# Patient Record
Sex: Female | Born: 1980 | Race: Black or African American | Hispanic: No | Marital: Married | Smoking: Never smoker
Health system: Southern US, Community
[De-identification: ages and names within clinical notes are randomized; demographics above are authoritative.]

## PROBLEM LIST (undated history)

## (undated) DIAGNOSIS — I4891 Unspecified atrial fibrillation: Secondary | ICD-10-CM

## (undated) DIAGNOSIS — M722 Plantar fascial fibromatosis: Secondary | ICD-10-CM

## (undated) DIAGNOSIS — E7800 Pure hypercholesterolemia, unspecified: Secondary | ICD-10-CM

## (undated) DIAGNOSIS — J45909 Unspecified asthma, uncomplicated: Secondary | ICD-10-CM

## (undated) DIAGNOSIS — E119 Type 2 diabetes mellitus without complications: Secondary | ICD-10-CM

## (undated) DIAGNOSIS — I10 Essential (primary) hypertension: Secondary | ICD-10-CM

---

## 2022-02-24 ENCOUNTER — Telehealth (HOSPITAL_COMMUNITY): Payer: Self-pay | Admitting: Emergency Medicine

## 2022-02-24 ENCOUNTER — Emergency Department (HOSPITAL_COMMUNITY)
Admission: EM | Admit: 2022-02-24 | Discharge: 2022-02-24 | Disposition: A | Discharge status: Home / Self Care | Payer: BLUE CROSS/BLUE SHIELD | Attending: Emergency Medicine | Admitting: Emergency Medicine

## 2022-02-24 ENCOUNTER — Encounter (HOSPITAL_COMMUNITY): Payer: Self-pay | Admitting: Emergency Medicine

## 2022-02-24 DIAGNOSIS — R0981 Nasal congestion: Secondary | ICD-10-CM | POA: Diagnosis present

## 2022-02-24 DIAGNOSIS — U071 COVID-19: Secondary | ICD-10-CM | POA: Insufficient documentation

## 2022-02-24 HISTORY — DX: Unspecified asthma, uncomplicated: J45.909

## 2022-02-24 HISTORY — DX: Pure hypercholesterolemia, unspecified: E78.00

## 2022-02-24 HISTORY — DX: Plantar fascial fibromatosis: M72.2

## 2022-02-24 HISTORY — DX: Unspecified atrial fibrillation: I48.91

## 2022-02-24 HISTORY — DX: Type 2 diabetes mellitus without complications: E11.9

## 2022-02-24 LAB — RESP PANEL BY RT-PCR (FLU A&B, COVID) ARPGX2
Influenza A by PCR: NEGATIVE
Influenza B by PCR: NEGATIVE
SARS Coronavirus 2 by RT PCR: POSITIVE — AB

## 2022-02-24 MED ORDER — PREDNISONE 20 MG PO TABS
60.0000 mg | ORAL_TABLET | ORAL | Status: AC
  Administered 2022-02-24: 60 mg via ORAL
  Filled 2022-02-24: qty 3

## 2022-02-24 MED ORDER — PREDNISONE 20 MG PO TABS: 40.0000 mg | ORAL_TABLET | Freq: Every day | ORAL | 0 refills | Status: DC

## 2022-02-24 MED ORDER — NIRMATRELVIR/RITONAVIR (PAXLOVID)TABLET: 3.0000 | ORAL_TABLET | Freq: Two times a day (BID) | ORAL | 0 refills | Status: AC

## 2022-02-24 MED ORDER — AZITHROMYCIN 250 MG PO TABS
500.0000 mg | ORAL_TABLET | Freq: Once | ORAL | Status: AC
  Administered 2022-02-24: 500 mg via ORAL
  Filled 2022-02-24: qty 2

## 2022-02-24 MED ORDER — AZITHROMYCIN 250 MG PO TABS: 250.0000 mg | ORAL_TABLET | Freq: Every day | ORAL | 0 refills | Status: AC

## 2022-02-24 NOTE — ED Triage Notes (Signed)
Patient complains of generalized body aches and nasal congestion that started three days ago. Patient states her husband tested positive for COVID last week, she was tested at her job yesterday and was negative. Patient alert, afebrile in triage, oriented, speaking in complete sentences and in no apparent distress a this time. ?

## 2022-02-24 NOTE — ED Provider Notes (Signed)
?[facility] EMERGENCY DEPARTMENT ?Provider Note ? ? ?CSN: [id number] ?Arrival date & time: 02/24/22  1525 ? ?  ? ?History ? ?Chief Complaint  ?Patient presents with  ? Generalized Body Aches  ? ? ?Mackenzie Dillon is a 41 y.o. female. ? ?HPI ?Patient presents with her husband.  She presents due to sinus congestion, flulike soreness throughout her body without focal pain, without dyspnea.  There is associated cough.  Minimal relief with OTC medication.  Symptom onset was 3 days ago.  She notes that her husband was diagnosed with COVID about 2 weeks ago, though he had minimal symptoms at that point.  She is a non-smoker, does have hypertension but does not have diabetes. ?  ? ?Home Medications ?Prior to Admission medications   ?Not on File  ?   ? ?Allergies    ?Hydrocodone and Sulfa antibiotics   ? ?Review of Systems   ?Review of Systems  ?Constitutional:   ?     Per HPI, otherwise negative  ?HENT:    ?     Per HPI, otherwise negative  ?Respiratory:    ?     Per HPI, otherwise negative  ?Cardiovascular:   ?     Per HPI, otherwise negative  ?Gastrointestinal:  Negative for vomiting.  ?Endocrine:  ?     Negative aside from HPI  ?Genitourinary:   ?     Neg aside from HPI   ?Musculoskeletal:   ?     Per HPI, otherwise negative  ?Skin: Negative.   ?Neurological:  Negative for syncope.  ? ?Physical Exam ?Updated Vital Signs ?BP 138/90 (BP Location: Right Arm)   Pulse 72   Temp 99 ?F (37.2 ?C) (Oral)   Resp 17   SpO2 98%  ?Physical Exam ?Vitals and nursing note reviewed.  ?Constitutional:   ?   General: She is not in acute distress. ?   Appearance: She is well-developed.  ?HENT:  ?   Head: Normocephalic and atraumatic.  ?Eyes:  ?   Conjunctiva/sclera: Conjunctivae normal.  ?Cardiovascular:  ?   Rate and Rhythm: Normal rate and regular rhythm.  ?Pulmonary:  ?   Effort: Pulmonary effort is normal. No respiratory distress.  ?   Breath sounds: Normal breath sounds. No stridor.  ?Abdominal:  ?   General: There  is no distension.  ?Skin: ?   General: Skin is warm and dry.  ?Neurological:  ?   Mental Status: She is alert and oriented to person, place, and time.  ?   Cranial Nerves: No cranial nerve deficit.  ?Psychiatric:     ?   Mood and Affect: Mood normal.  ? ? ?ED Results / Procedures / Treatments   ?Labs ?(all labs ordered are listed, but only abnormal results are displayed) ?Labs Reviewed  ?RESP PANEL BY RT-PCR (FLU A&B, COVID) ARPGX2  ? ? ?EKG ?None ? ?Radiology ?No results found. ? ?Procedures ?Procedures  ? ? ?Medications Ordered in ED ?Medications - No data to display ? ?ED Course/ Medical Decision Making/ A&P ?This patient presents to the ED for concern of cough, congestion, flulike illness, this involves an extensive number of treatment options, and is a complaint that carries with it a high risk of complications and morbidity.  The differential diagnosis includes COVID, influenza, bacteremia, sepsis ? ? ?Co morbidities that complicate the patient evaluation ? ?Obesity, hypertension ? ?Social Determinants of Health: ? ?Obesity, hypertension ? ?Additional history obtained: ? ?Additional history and/or information obtained from husband ?External  records from outside source obtained and reviewed including history of present illness ? ? ?After the initial evaluation, orders, including: Labs were initiated. ? ? ?Patient placed on Cardiac and Pulse-Oximetry Monitors. ?The patient was maintained on a cardiac monitor.  The cardiac monitored showed an rhythm of sinus 70 normal ?The patient was also maintained on pulse oximetry. The readings were typically 100% room air normal ? ? ?On repeat evaluation of the patient improved ? ?Lab Tests: ? ?I personally interpreted labs.  The pertinent results include:  negative, flu, COVID ? ? ? ?Dispostion / Final MDM: ? ?After consideration of the diagnostic results and the patient's response to treatment, remains awake and alert, upright, hemodynamically unremarkable.  We discussed  likelihood of bronchitis versus other viral processes.  She notes that she typically has pneumonia with this, though her lung sounds are clear, no hypoxia, is taking in consideration on discharge.  No evidence for bacteremia, sepsis, no indication for admission. ? ? ?Final Clinical Impression(s) / ED Diagnoses ?Final diagnoses:  ?Flu-like symptoms  ? ?  ?Carmin Muskrat, MD ?02/24/22 1858 ? ?

## 2022-02-24 NOTE — Discharge Instructions (Addendum)
As discussed, your evaluation today has been largely reassuring.  But, it is important that you monitor your condition carefully, and do not hesitate to return to the ED if you develop new, or concerning changes in your condition. ? ?Otherwise, please follow-up with your physician for appropriate ongoing care. ? ?

## 2022-02-24 NOTE — ED Provider Triage Note (Signed)
Emergency Medicine Provider Triage Evaluation Note ? ?Mackenzie Dillon , a 41 y.o. female  was evaluated in triage.  Pt complains of body aches, sinus congestion, nonproductive cough, fever of 3-day duration.  Patient reports her husband had COVID last week.  She states she has had a home COVID test that was negative.  She denies chest pain, shortness of breath. ? ?Review of Systems  ?Positive: As above ?Negative: As above ? ?Physical Exam  ?BP (!) 147/99 (BP Location: Right Arm)   Pulse 74   Temp 99 ?F (37.2 ?C) (Oral)   Resp 16   SpO2 98%  ?Gen:   Awake, no distress   ?Resp:  Normal effort  ?MSK:   Moves extremities without difficulty  ?Other:   ? ?Medical Decision Making  ?Medically screening exam initiated at 3:51 PM.  Appropriate orders placed.  Paighton Godette was informed that the remainder of the evaluation will be completed by another provider, this initial triage assessment does not replace that evaluation, and the importance of remaining in the ED until their evaluation is complete. ? ? ?  ?Marita Kansas, PA-C ?02/24/22 1553 ? ?

## 2022-02-25 ENCOUNTER — Other Ambulatory Visit (HOSPITAL_COMMUNITY): Payer: Self-pay

## 2022-02-25 ENCOUNTER — Emergency Department (HOSPITAL_COMMUNITY)
Admission: RE | Admit: 2022-02-25 | Discharge: 2022-02-25 | Disposition: A | Discharge status: Home / Self Care | Payer: BLUE CROSS/BLUE SHIELD | Source: Ambulatory Visit

## 2022-02-25 DIAGNOSIS — R519 Headache, unspecified: Secondary | ICD-10-CM

## 2022-08-31 ENCOUNTER — Other Ambulatory Visit: Payer: Self-pay

## 2022-08-31 ENCOUNTER — Emergency Department (HOSPITAL_COMMUNITY): Payer: Self-pay

## 2022-08-31 ENCOUNTER — Emergency Department (HOSPITAL_COMMUNITY)
Admission: EM | Admit: 2022-08-31 | Discharge: 2022-09-01 | Disposition: A | Discharge status: Home / Self Care | Payer: Self-pay | Attending: Emergency Medicine | Admitting: Emergency Medicine

## 2022-08-31 ENCOUNTER — Encounter (HOSPITAL_COMMUNITY): Payer: Self-pay | Admitting: Emergency Medicine

## 2022-08-31 DIAGNOSIS — R519 Headache, unspecified: Secondary | ICD-10-CM | POA: Insufficient documentation

## 2022-08-31 DIAGNOSIS — M549 Dorsalgia, unspecified: Secondary | ICD-10-CM | POA: Insufficient documentation

## 2022-08-31 DIAGNOSIS — Z20822 Contact with and (suspected) exposure to covid-19: Secondary | ICD-10-CM | POA: Insufficient documentation

## 2022-08-31 DIAGNOSIS — M62838 Other muscle spasm: Secondary | ICD-10-CM | POA: Insufficient documentation

## 2022-08-31 HISTORY — DX: Essential (primary) hypertension: I10

## 2022-08-31 LAB — CBC WITH DIFFERENTIAL/PLATELET
Abs Immature Granulocytes: 0.02 10*3/uL (ref 0.00–0.07)
Basophils Absolute: 0 10*3/uL (ref 0.0–0.1)
Basophils Relative: 1 %
Eosinophils Absolute: 0.1 10*3/uL (ref 0.0–0.5)
Eosinophils Relative: 2 %
HCT: 34.9 % — ABNORMAL LOW (ref 36.0–46.0)
Hemoglobin: 11.7 g/dL — ABNORMAL LOW (ref 12.0–15.0)
Immature Granulocytes: 0 %
Lymphocytes Relative: 47 %
Lymphs Abs: 3.1 10*3/uL (ref 0.7–4.0)
MCH: 32.8 pg (ref 26.0–34.0)
MCHC: 33.5 g/dL (ref 30.0–36.0)
MCV: 97.8 fL (ref 80.0–100.0)
Monocytes Absolute: 0.3 10*3/uL (ref 0.1–1.0)
Monocytes Relative: 5 %
Neutro Abs: 2.9 10*3/uL (ref 1.7–7.7)
Neutrophils Relative %: 45 %
Platelets: 335 10*3/uL (ref 150–400)
RBC: 3.57 MIL/uL — ABNORMAL LOW (ref 3.87–5.11)
RDW: 12.9 % (ref 11.5–15.5)
WBC: 6.4 10*3/uL (ref 4.0–10.5)
nRBC: 0 % (ref 0.0–0.2)

## 2022-08-31 LAB — I-STAT BETA HCG BLOOD, ED (MC, WL, AP ONLY): I-stat hCG, quantitative: <5 m[IU]/mL (ref <5)

## 2022-08-31 MED ORDER — METHOCARBAMOL 500 MG PO TABS
500.0000 mg | ORAL_TABLET | Freq: Once | ORAL | Status: AC
  Administered 2022-08-31: 500 mg via ORAL
  Filled 2022-08-31: qty 1

## 2022-08-31 MED ORDER — LIDOCAINE 5 % EX PTCH
2.0000 | MEDICATED_PATCH | Freq: Every day | CUTANEOUS | Status: DC
  Administered 2022-08-31: 2 via TRANSDERMAL
  Filled 2022-08-31 (×3): qty 2

## 2022-08-31 NOTE — ED Triage Notes (Signed)
Patient reports occipital headache " spasms" onset yesterday radiating down to neck , denies head injury /no stiffness or fever .

## 2022-08-31 NOTE — ED Provider Triage Note (Signed)
Emergency Medicine Provider Triage Evaluation Note  Mackenzie Dillon , a 41 y.o. female  was evaluated in triage.  Pt complains of headache. Patient reports pain is to the back of her head and into her neck and upper back. Back pain for a a little while now, head/neck pain more so since yesterday. Constant, no alleviating/aggravating factors. Has some bilateral hand paresthesias at times with intermittent vision changes but has had vision changes in the past.. Denies fever, chills, dizziness, syncope, head injury, nausea, or vomiting. Hx of migraines but this is different, denies hx of similar headache.   Review of Systems  Per above.   Physical Exam  BP (!) 141/84 (BP Location: Right Arm)   Pulse 61   Temp 99.2 F (37.3 C) (Oral)   Resp 18   LMP 08/18/2022   SpO2 100%  Gen:   Awake, no distress   Resp:  Normal effort  MSK:   Moves extremities without difficulty  Other:  PERRL EOMI. Clear speech. No facial droop. Sensation grossly intact x 4. Intact grip strength. Intact finger to nose. No pronator drift. TTP throughout cervical and upper thoracic region.   Medical Decision Making  Medically screening exam initiated at 11:02 PM.  Appropriate orders placed.  Mackenzie Dillon was informed that the remainder of the evaluation will be completed by another provider, this initial triage assessment does not replace that evaluation, and the importance of remaining in the ED until their evaluation is complete.  Headache   Amaryllis Dyke, PA-C 08/31/22 2306

## 2022-09-01 ENCOUNTER — Emergency Department (HOSPITAL_COMMUNITY): Payer: Self-pay

## 2022-09-01 LAB — BASIC METABOLIC PANEL
Anion gap: 12 (ref 5–15)
BUN: 14 mg/dL (ref 6–20)
CO2: 19 mmol/L — ABNORMAL LOW (ref 22–32)
Calcium: 8.8 mg/dL — ABNORMAL LOW (ref 8.9–10.3)
Chloride: 108 mmol/L (ref 98–111)
Creatinine, Ser: 0.75 mg/dL (ref 0.44–1.00)
GFR, Estimated: >60 mL/min (ref >60)
Glucose, Bld: 134 mg/dL — ABNORMAL HIGH (ref 70–99)
Potassium: 3.9 mmol/L (ref 3.5–5.1)
Sodium: 139 mmol/L (ref 135–145)

## 2022-09-01 LAB — RESP PANEL BY RT-PCR (FLU A&B, COVID) ARPGX2
Influenza A by PCR: NEGATIVE
Influenza B by PCR: NEGATIVE
SARS Coronavirus 2 by RT PCR: NEGATIVE

## 2022-09-01 MED ORDER — DEXAMETHASONE SODIUM PHOSPHATE 10 MG/ML IJ SOLN
10.0000 mg | Freq: Once | INTRAMUSCULAR | Status: AC
  Administered 2022-09-01: 10 mg via INTRAVENOUS
  Filled 2022-09-01: qty 1

## 2022-09-01 MED ORDER — KETOROLAC TROMETHAMINE 15 MG/ML IJ SOLN
30.0000 mg | Freq: Once | INTRAMUSCULAR | Status: AC
  Administered 2022-09-01: 30 mg via INTRAVENOUS
  Filled 2022-09-01: qty 2

## 2022-09-01 MED ORDER — DIPHENHYDRAMINE HCL 50 MG/ML IJ SOLN
12.5000 mg | Freq: Once | INTRAMUSCULAR | Status: AC
  Administered 2022-09-01: 12.5 mg via INTRAVENOUS
  Filled 2022-09-01: qty 1

## 2022-09-01 MED ORDER — PREDNISONE 20 MG PO TABS: 20.0000 mg | ORAL_TABLET | Freq: Every day | ORAL | 0 refills | Status: AC

## 2022-09-01 MED ORDER — METHOCARBAMOL 500 MG PO TABS: 500.0000 mg | ORAL_TABLET | Freq: Two times a day (BID) | ORAL | 0 refills | Status: DC

## 2022-09-01 MED ORDER — LIDOCAINE 5 % EX PTCH: 1.0000 | MEDICATED_PATCH | CUTANEOUS | 0 refills | Status: DC

## 2022-09-01 MED ORDER — PROCHLORPERAZINE EDISYLATE 10 MG/2ML IJ SOLN
10.0000 mg | Freq: Once | INTRAMUSCULAR | Status: AC
  Administered 2022-09-01: 10 mg via INTRAVENOUS
  Filled 2022-09-01: qty 2

## 2022-09-01 NOTE — Discharge Instructions (Signed)
Take medications as perscrbed.  Use warm compress to your neck.  Return for new or worsening symptoms.

## 2022-09-01 NOTE — ED Notes (Signed)
Pt had to leave to go to work, he requested an update once the dr sees his [employer] [phone]

## 2022-09-01 NOTE — ED Provider Notes (Signed)
[facility] EMERGENCY DEPARTMENT Provider Note   CSN: [id number] Arrival date & time: 08/31/22  2233    History  Chief Complaint  Patient presents with   Headache    Mackenzie Dillon is a 41 y.o. female here for evaluation of headache and back pain. Began last week.  Reports the pain is in the posterior aspect of her head into her neck and upper back. Spreads into her bilateral shoulders.  States she has occasional tingling in her bilateral hands however this has been present previously. No recent injury or trauma.  No diplopia.  No history of MS.  No fever, sudden onset thunderclap headache.  No weakness, syncope.  Pain worse with movement and palpation to neck/ posterior aspect head. Has history of migraines however states this feels different.  Given lidocaine patch as well as muscle relaxer in triage.  She is unsure if this is helped. Gets BIL floaters with hx of migraines.  HPI     Home Medications Prior to Admission medications   Medication Sig Start Date End Date Taking? Authorizing Provider  lidocaine (LIDODERM) 5 % Place 1 patch onto the skin daily. Remove & Discard patch within 12 hours or as directed by MD 09/01/22  Yes Heiley Shaikh A, PA-C  methocarbamol (ROBAXIN) 500 MG tablet Take 1 tablet (500 mg total) by mouth 2 (two) times daily. 09/01/22  Yes Kodah Maret A, PA-C  predniSONE (DELTASONE) 20 MG tablet Take 1 tablet (20 mg total) by mouth daily for 4 days. 09/01/22 09/05/22 Yes Deshanda Molitor A, PA-C      Allergies    Hydrocodone and Sulfa antibiotics    Review of Systems   Review of Systems  Constitutional: Negative.   HENT: Negative.    Respiratory: Negative.    Cardiovascular: Negative.   Gastrointestinal: Negative.   Genitourinary: Negative.   Musculoskeletal:  Positive for neck pain. Negative for arthralgias, back pain, gait problem, joint swelling, myalgias and neck stiffness.  Skin: Negative.   Neurological:  Positive for  headaches. Negative for dizziness, tremors, seizures, syncope, facial asymmetry, speech difficulty, weakness, light-headedness and numbness.  All other systems reviewed and are negative.   Physical Exam Updated Vital Signs BP 126/85   Pulse 65   Temp 98 F (36.7 C) (Oral)   Resp 20   LMP 08/18/2022   SpO2 98%  Physical Exam Physical Exam  Constitutional: Pt is oriented to person, place, and time. Pt appears well-developed and well-nourished. No distress.  HENT:  Head: Normocephalic and atraumatic.  Mouth/Throat: Oropharynx is clear and moist.  Eyes: Conjunctivae and EOM are normal. Pupils are equal, round, and reactive to light. No scleral icterus.  No horizontal, vertical or rotational nystagmus  Neck: Normal range of motion. Neck supple.  Full active and passive ROM without pain No midline or paraspinal tenderness No nuchal rigidity or meningeal signs  Diffuse tenderness to BIL trapezius region. Cardiovascular: Normal rate, regular rhythm and intact distal pulses.   Pulmonary/Chest: Effort normal and breath sounds normal. No respiratory distress. Pt has no wheezes. No rales.  Abdominal: Soft. Bowel sounds are normal. There is no tenderness. There is no rebound and no guarding.  Musculoskeletal: Normal range of motion.  Lymphadenopathy:    No cervical adenopathy.  Neurological: Pt. is alert and oriented to person, place, and time. He has normal reflexes. No cranial nerve deficit.  Exhibits normal muscle tone. Coordination normal.  Mental Status:  Alert, oriented, thought content appropriate. Speech fluent without evidence of aphasia.  Able to follow 2 step commands without difficulty.  Cranial Nerves:  2-12 grossly intact Motor:  Equal strength bil Intact sensation Cerebellar: intact Gait: normal gait CV: Pulses eqial BIL Skin: Skin is warm and dry. No rash noted. Pt is not diaphoretic.  Psychiatric: Pt has a normal mood and affect. Behavior is normal. Judgment and thought  content normal.  Nursing note and vitals reviewed.  ED Results / Procedures / Treatments   Labs (all labs ordered are listed, but only abnormal results are displayed) Labs Reviewed  CBC WITH DIFFERENTIAL/PLATELET - Abnormal; Notable for the following components:      Result Value   RBC 3.57 (*)    Hemoglobin 11.7 (*)    HCT 34.9 (*)    All other components within normal limits  BASIC METABOLIC PANEL - Abnormal; Notable for the following components:   CO2 19 (*)    Glucose, Bld 134 (*)    Calcium 8.8 (*)    All other components within normal limits  RESP PANEL BY RT-PCR (FLU A&B, COVID) ARPGX2  I-STAT BETA HCG BLOOD, ED ([facility], [facility], [facility] ONLY)    EKG None  Radiology CT Head Wo Contrast  Result Date: 09/01/2022 CLINICAL DATA:  Occipital headache. EXAM: CT HEAD WITHOUT CONTRAST TECHNIQUE: Contiguous axial images were obtained from the base of the skull through the vertex without intravenous contrast. RADIATION DOSE REDUCTION: This exam was performed according to the departmental dose-optimization program which includes automated exposure control, adjustment of the mA and/or kV according to patient size and/or use of iterative reconstruction technique. COMPARISON:  February 24, 2022 FINDINGS: Brain: No evidence of acute infarction, hemorrhage, hydrocephalus, extra-axial collection or mass lesion/mass effect. Vascular: No hyperdense vessel or unexpected calcification. Skull: Normal. Negative for fracture or focal lesion. Sinuses/Orbits: No acute finding. Other: None. IMPRESSION: No acute intracranial pathology. Electronically Signed   By: Aram Candela M.D.   On: 09/01/2022 00:23    Procedures Procedures    Medications Ordered in ED Medications  lidocaine (LIDODERM) 5 % 2 patch (2 patches Transdermal Patch Applied 08/31/22 2310)  methocarbamol (ROBAXIN) tablet 500 mg (500 mg Oral Given 08/31/22 2310)  ketorolac (TORADOL) 15 MG/ML injection 30 mg (30 mg Intravenous Given 09/01/22 0815)   prochlorperazine (COMPAZINE) injection 10 mg (10 mg Intravenous Given 09/01/22 0816)  diphenhydrAMINE (BENADRYL) injection 12.5 mg (12.5 mg Intravenous Given 09/01/22 0818)  dexamethasone (DECADRON) injection 10 mg (10 mg Intravenous Given 09/01/22 7829)    ED Course/ Medical Decision Making/ A&P    Patient here for evaluation of pain that begins in her central region of her head extending to the back of her neck and into her bilateral shoulders.  She is a nonfocal neuro exam without deficits.  I am able to reproduce her pain on exam.  Denies any recent injury or trauma.  Work-up work-up started from triage today personally reviewed  Labs and imaging personally viewed and interpreted:  CBC without leukocytosis, hemoglobin 8.7, admits to heavy menstrual cycles COVID, flu negative Metabolic panel glucose 134 Pregnancy test negative CT scan without significant abnormality.  Different diagnosis includes: Migraine, meningitis, Fx, dislocation, MS, transverse myelitis, infectious process, abscess, dissection, VTE  Patient given migraine cocktail.  I reassessed her after this.  Her pain is significantly improved.  Her pain is likely MSK in etiology.  At this time I have low suspicion for acute neurosurgical process, infectious process or vascular etiology of cause of her symptoms.  She has full range of motion to her neck  symptoms not seen consistent with meningitis.  Recent traumatic injuries to suggest traumatic cause.  No chest pain, shortness of breath, PERC negative to suggest abnormal or thoracic etiology of her pain.  Low suspicion for MS as cause of her symptoms.  DC home with short course of medications to help.  She will return for new or worsening symptoms.  The patient has been appropriately medically screened and/or stabilized in the ED. I have low suspicion for any other emergent medical condition which would require further screening, evaluation or treatment in the ED or require  inpatient management.  Patient is hemodynamically stable and in no acute distress.  Patient able to ambulate in department prior to ED.  Evaluation does not show acute pathology that would require ongoing or additional emergent interventions while in the emergency department or further inpatient treatment.  I have discussed the diagnosis with the patient and answered all questions.  Pain is been managed while in the emergency department and patient has no further complaints prior to discharge.  Patient is comfortable with plan discussed in room and is stable for discharge at this time.  I have discussed strict return precautions for returning to the emergency department.  Patient was encouraged to follow-up with PCP/specialist refer to at discharge.                            Medical Decision Making Amount and/or Complexity of Data Reviewed External Data Reviewed: labs, radiology and notes. Labs: ordered. Decision-making details documented in ED Course. Radiology: ordered and independent interpretation performed. Decision-making details documented in ED Course.  Risk OTC drugs. Prescription drug management. Parenteral controlled substances. Decision regarding hospitalization. Diagnosis or treatment significantly limited by social determinants of health.          Final Clinical Impression(s) / ED Diagnoses Final diagnoses:  Acute nonintractable headache, unspecified headache type  Trapezius muscle spasm    Rx / DC Orders ED Discharge Orders          Ordered    methocarbamol (ROBAXIN) 500 MG tablet  2 times daily        09/01/22 1017    predniSONE (DELTASONE) 20 MG tablet  Daily        09/01/22 1017    lidocaine (LIDODERM) 5 %  Every 24 hours        09/01/22 1017              Nolan Tuazon A, PA-C 09/01/22 1025    Terald Sleeper, MD 09/01/22 1157

## 2022-10-19 ENCOUNTER — Ambulatory Visit (HOSPITAL_COMMUNITY)
Admission: EM | Admit: 2022-10-19 | Discharge: 2022-10-19 | Disposition: A | Discharge status: Home / Self Care | Payer: Self-pay | Attending: Family Medicine | Admitting: Family Medicine

## 2022-10-19 ENCOUNTER — Encounter (HOSPITAL_COMMUNITY): Payer: Self-pay | Admitting: Emergency Medicine

## 2022-10-19 ENCOUNTER — Other Ambulatory Visit: Payer: Self-pay

## 2022-10-19 DIAGNOSIS — N3090 Cystitis, unspecified without hematuria: Secondary | ICD-10-CM | POA: Insufficient documentation

## 2022-10-19 LAB — POCT URINALYSIS DIPSTICK, ED / UC
Bilirubin Urine: NEGATIVE
Glucose, UA: 100 mg/dL — AB
Leukocytes,Ua: NEGATIVE
Nitrite: POSITIVE — AB
Protein, ur: NEGATIVE mg/dL
Specific Gravity, Urine: 1.025 (ref 1.005–1.030)
Urobilinogen, UA: 1 mg/dL (ref 0.0–1.0)
pH: 5.5 (ref 5.0–8.0)

## 2022-10-19 MED ORDER — CEPHALEXIN 500 MG PO CAPS: 500.0000 mg | ORAL_CAPSULE | Freq: Two times a day (BID) | ORAL | 0 refills | Status: DC

## 2022-10-19 NOTE — ED Triage Notes (Addendum)
Yesterday evening noticed frequent urination.  Took one azo.  Reports frequent need to urinate for a small amount of urine.  Denies abdominal pain, denies back pain.    Patient is requesting a paper script

## 2022-10-20 LAB — URINE CULTURE: Culture: NO GROWTH

## 2022-10-20 NOTE — ED Provider Notes (Signed)
  [facility]    ASSESSMENT & PLAN:  1. Cystitis    Meds ordered this encounter  Medications   cephALEXin (KEFLEX) 500 MG capsule    Sig: Take 1 capsule (500 mg total) by mouth 2 (two) times daily.    Dispense:  10 capsule    Refill:  0   Not enough urine to send for culture.  No signs of pyelonephritis. Urine culture sent. Will notify patient of any significant results. Will follow up with her PCP or here if not showing improvement over the next 48 hours, sooner if needed.  Outlined signs and symptoms indicating need for more acute intervention. Patient verbalized understanding. After Visit Summary given.  SUBJECTIVE:  Mackenzie Dillon is a 41 y.o. female who, yesterday evening, noticed frequent urination. Took one azo. Reports frequent need to urinate for a small amount of urine.  Denies abdominal pain, denies back pain.   Afebrile. Very distant h/o UTI.  LMP: Patient's last menstrual period was 10/13/2022.  OBJECTIVE:  Vitals:   10/19/22 1953  BP: (!) 140/94  Pulse: 76  Resp: 18  Temp: 99.1 F (37.3 C)  TempSrc: Oral  SpO2: 96%   General appearance: alert; no distress HENT: oropharynx: moist Lungs: unlabored respirations Extremities: no edema Skin: warm and dry Neurologic: normal gait Psychological: alert and cooperative; normal mood and affect  Labs Reviewed  POCT URINALYSIS DIPSTICK, ED / UC - Abnormal; Notable for the following components:      Result Value   Glucose, UA 100 (*)    Ketones, ur TRACE (*)    Hgb urine dipstick TRACE (*)    Nitrite POSITIVE (*)    All other components within normal limits  URINE CULTURE    Allergies  Allergen Reactions   Hydrocodone Itching   Sulfa Antibiotics     Past Medical History:  Diagnosis Date   Asthma    Atrial fibrillation (HCC)    Diabetes mellitus without complication (HCC)    Hypercholesterolemia    Hypertension    Plantar fasciitis    Social History   Socioeconomic History    Marital status: Married    Spouse name: Not on file   Number of children: Not on file   Years of education: Not on file   Highest education level: Not on file  Occupational History   Not on file  Tobacco Use   Smoking status: Never   Smokeless tobacco: Never  Vaping Use   Vaping Use: Never used  Substance and Sexual Activity   Alcohol use: Never   Drug use: Never   Sexual activity: Not on file  Other Topics Concern   Not on file  Social History Narrative   Not on file   Social Determinants of Health   Financial Resource Strain: Not on file  Food Insecurity: Not on file  Transportation Needs: Not on file  Physical Activity: Not on file  Stress: Not on file  Social Connections: Not on file  Intimate Partner Violence: Not on file   History reviewed. No pertinent family history.      Mardella Layman, MD 10/20/22 [phone]

## 2022-11-16 ENCOUNTER — Ambulatory Visit
Admission: EM | Admit: 2022-11-16 | Discharge: 2022-11-16 | Disposition: A | Discharge status: Home / Self Care | Payer: Self-pay | Attending: Emergency Medicine | Admitting: Emergency Medicine

## 2022-11-16 DIAGNOSIS — B349 Viral infection, unspecified: Secondary | ICD-10-CM | POA: Insufficient documentation

## 2022-11-16 DIAGNOSIS — Z1152 Encounter for screening for COVID-19: Secondary | ICD-10-CM | POA: Insufficient documentation

## 2022-11-16 LAB — RESP PANEL BY RT-PCR (FLU A&B, COVID) ARPGX2
Influenza A by PCR: NEGATIVE
Influenza B by PCR: NEGATIVE
SARS Coronavirus 2 by RT PCR: NEGATIVE

## 2022-11-16 NOTE — Discharge Instructions (Signed)
You received a COVID-19 and influenza PCR test today.  The results of your PCR testing will be posted to your MyChart once it is complete.  This typically takes 6 to 12 hours.    If your influenza PCR test is positive, you will be contacted by phone.  Please complete full 5-day course of Tamiflu, a prescription will be provided for you.     If your COVID-19 PCR test is positive, you will be contacted by phone.  Please discuss with the callback nurse whether or not you would benefit from antiviral therapy treatment for COVID-19.    Please read below to learn more about the medications, dosages and frequencies that I recommend to help alleviate your symptoms and to get you feeling better soon:   Advil, Motrin (ibuprofen): This is a good anti-inflammatory medication which addresses aches, pains and inflammation of the upper airways that causes sinus and nasal congestion as well as in the lower airways which makes your cough feel tight and sometimes burn.  I recommend that you take between 400 to 600 mg every 6-8 hours as needed.      Tylenol (acetaminophen): This is a good fever reducer.  If your body temperature rises above 101.5 as measured with a thermometer, it is recommended that you take 1,000 mg every 8 hours until your temperature falls below 101.5, please not take more than 3,000 mg of acetaminophen either as a separate medication or as in ingredient in an over-the-counter cold/flu preparation within a 24-hour period.      Robitussin, Mucinex (guaifenesin): This is an expectorant.  This helps break up chest congestion and loosen up thick nasal drainage making phlegm and drainage more liquid and therefore easier to remove.  I recommend being 400 mg three times daily as needed.      Dextromethorphan (any cough medicine with the letters "DM" added to it's name such as Robitussin DM): This is a cough suppressant.  This is often recommended to be taken at nighttime to suppress cough and help children  sleep.  Give dosage as directed on the bottle.  This medication is available over-the-counter.   Chloraseptic Throat Spray: Spray 5 sprays into affected area every 2 hours, hold for 15 seconds and either swallow or spit it out.  This is a excellent numbing medication because it is a spray, you can put it right where you needed and so sucking on a lozenge and numbing your entire mouth.      Please follow-up within the next 5-7 days either with your primary care provider or urgent care if your symptoms do not resolve.  If you do not have a primary care provider, we will assist you in finding one.        Thank you for visiting urgent care today.  We appreciate the opportunity to participate in your care.

## 2022-11-16 NOTE — ED Provider Notes (Signed)
[facility]    CSN: [id number] Arrival date & time: 11/16/22  [id number]    HISTORY   Chief Complaint  Patient presents with   Generalized Body Aches   Sore Throat   Cough   Otalgia   Headache   HPI Mackenzie Dillon is a pleasant, 41 y.o. female who presents to urgent care today. Patient reports a 1 day history of generalized body aches, sore throat, nasal congestion, sinus headache without radiation to teeth, bilateral ear pain as well as a nonproductive cough that began this morning.  Patient denies fever but endorses chills.  Patient denies nausea, vomiting, diarrhea, known sick contacts.  Patient states she is took Sudafed this morning without meaningful relief of her symptoms, has not tried anything else.  Patient has elevated blood pressure on arrival, vital signs are otherwise normal.  The history is provided by the patient.   Past Medical History:  Diagnosis Date   Asthma    Atrial fibrillation ([place])    Diabetes mellitus without complication ([place])    Hypercholesterolemia    Hypertension    Plantar fasciitis    There are no problems to display for this patient.  History reviewed. No pertinent surgical history. OB History   No obstetric history on file.    Home Medications    Prior to Admission medications   Medication Sig Start Date End Date Taking? Authorizing Provider  atorvastatin (LIPITOR) 40 MG tablet Take 40 mg by mouth daily.    [provider]  cephALEXin (KEFLEX) 500 MG capsule Take 1 capsule (500 mg total) by mouth 2 (two) times daily. 10/19/22   Vanessa Kick, MD  escitalopram (LEXAPRO) 20 MG tablet Take 20 mg by mouth daily.    [provider]  lidocaine (LIDODERM) 5 % Place 1 patch onto the skin daily. Remove & Discard patch within 12 hours or as directed by MD Patient not taking: Reported on 10/19/2022 09/01/22   Henderly, Britni A, PA-C  lisinopril (ZESTRIL) 10 MG tablet Take 1 tablet by mouth daily. 05/18/20   [provider]  methocarbamol (ROBAXIN) 500 MG tablet Take 1 tablet (500 mg total) by mouth 2 (two) times daily. Patient not taking: Reported on 10/19/2022 09/01/22   Henderly, Britni A, PA-C  montelukast (SINGULAIR) 10 MG tablet Take 1 tablet by mouth daily. 03/02/20   [provider]    Family History History reviewed. No pertinent family history. Social History Social History   Tobacco Use   Smoking status: Never   Smokeless tobacco: Never  Vaping Use   Vaping Use: Never used  Substance Use Topics   Alcohol use: Never   Drug use: Never   Allergies   Sulfur, Hydrocodone, Metformin, and Sulfa antibiotics  Review of Systems Review of Systems Pertinent findings revealed after performing a 14 point review of systems has been noted in the history of present illness.  Physical Exam Triage Vital Signs ED Triage Vitals  Enc Vitals Group     BP 10/08/21 0827 (!) 147/82     Pulse Rate 10/08/21 0827 72     Resp 10/08/21 0827 18     Temp 10/08/21 0827 98.3 F (36.8 C)     Temp Source 10/08/21 0827 Oral     SpO2 10/08/21 0827 98 %     Weight --      Height --      Head Circumference --      Peak Flow --      Pain Score  10/08/21 0826 5     Pain Loc --      Pain Edu? --      Excl. in [place]? --   No data found.  Updated Vital Signs BP (!) 146/96 (BP Location: Right Arm)   Pulse 80   Temp 98.6 F (37 C) (Oral)   Resp 17   LMP 11/12/2022 (Approximate)   SpO2 96%   Physical Exam Vitals and nursing note reviewed.  Constitutional:      General: She is not in acute distress.    Appearance: Normal appearance. She is not ill-appearing.  HENT:     Head: Normocephalic and atraumatic.     Salivary Glands: Right salivary gland is not diffusely enlarged or tender. Left salivary gland is not diffusely enlarged or tender.     Right Ear: Tympanic membrane, ear canal and external ear normal. No drainage. No middle ear effusion. There is no impacted cerumen. Tympanic membrane is  not erythematous or bulging.     Left Ear: Tympanic membrane, ear canal and external ear normal. No drainage.  No middle ear effusion. There is no impacted cerumen. Tympanic membrane is not erythematous or bulging.     Nose: Nose normal. No nasal deformity, septal deviation, mucosal edema, congestion or rhinorrhea.     Right Turbinates: Not enlarged, swollen or pale.     Left Turbinates: Not enlarged, swollen or pale.     Right Sinus: No maxillary sinus tenderness or frontal sinus tenderness.     Left Sinus: No maxillary sinus tenderness or frontal sinus tenderness.     Mouth/Throat:     Lips: Pink. No lesions.     Mouth: Mucous membranes are moist. No oral lesions.     Pharynx: Oropharynx is clear. Uvula midline. No posterior oropharyngeal erythema or uvula swelling.     Tonsils: No tonsillar exudate. 0 on the right. 0 on the left.  Eyes:     General: Lids are normal.        Right eye: No discharge.        Left eye: No discharge.     Extraocular Movements: Extraocular movements intact.     Conjunctiva/sclera: Conjunctivae normal.     Right eye: Right conjunctiva is not injected.     Left eye: Left conjunctiva is not injected.     Pupils: Pupils are equal, round, and reactive to light.  Neck:     Trachea: Trachea and phonation normal.  Cardiovascular:     Rate and Rhythm: Normal rate and regular rhythm.     Pulses: Normal pulses.     Heart sounds: Normal heart sounds. No murmur heard.    No friction rub. No gallop.  Pulmonary:     Effort: Pulmonary effort is normal. No tachypnea, bradypnea, accessory muscle usage, prolonged expiration, respiratory distress or retractions.     Breath sounds: Normal breath sounds and air entry. No stridor, decreased air movement or transmitted upper airway sounds. No decreased breath sounds, wheezing, rhonchi or rales.  Chest:     Chest wall: No tenderness.  Musculoskeletal:        General: Normal range of motion.     Cervical back: Full passive  range of motion without pain, normal range of motion and neck supple. Normal range of motion.  Lymphadenopathy:     Cervical: No cervical adenopathy.  Skin:    General: Skin is warm and dry.     Findings: No erythema or rash.  Neurological:     General:  No focal deficit present.     Mental Status: She is alert and oriented to person, place, and time.  Psychiatric:        Mood and Affect: Mood normal.        Behavior: Behavior normal.     Visual Acuity Right Eye Distance:   Left Eye Distance:   Bilateral Distance:    Right Eye Near:   Left Eye Near:    Bilateral Near:     [facility] / Diagnostics / Procedures:     Radiology No results found.  Procedures Procedures (including critical care time) EKG  Pending results:  Labs Reviewed  RESP PANEL BY RT-PCR (FLU A&B, COVID) [id number]    Medications Ordered in UC: Medications - No data to display  UC Diagnoses / Final Clinical Impressions(s)   I have reviewed the triage vital signs and the nursing notes.  Pertinent labs & imaging results that were available during my care of the patient were reviewed by me and considered in my medical decision making (see chart for details).    Final diagnoses:  Viral illness   Patient tested for COVID-19 and influenza given complaints of body aches, fatigue, chills and sore throat.  We will notify patient of results once received.  Patient would benefit from Tamiflu if influenza test is positive.  Patient has a history of very well-controlled type 2 diabetes without the use of medications.  It is my opinion that this patient does not have any significant comorbidities that would increase her risk of hospitalization should she test positive for COVID-19.  Conservative care recommended, supportive medications advised.  Return precautions advised.  ED Prescriptions   None    PDMP not reviewed this encounter.  Disposition Upon Discharge:  Condition: stable for discharge home Home: take  medications as prescribed; routine discharge instructions as discussed; follow up as advised.  Patient presented with an acute illness with associated systemic symptoms and significant discomfort requiring urgent management. In my opinion, this is a condition that a prudent lay person (someone who possesses an average knowledge of health and medicine) may potentially expect to result in complications if not addressed urgently such as respiratory distress, impairment of bodily function or dysfunction of bodily organs.   Routine symptom specific, illness specific and/or disease specific instructions were discussed with the patient and/or caregiver at length.   As such, the patient has been evaluated and assessed, work-up was performed and treatment was provided in alignment with urgent care protocols and evidence based medicine.  Patient/parent/caregiver has been advised that the patient may require follow up for further testing and treatment if the symptoms continue in spite of treatment, as clinically indicated and appropriate.  If the patient was tested for COVID-19, Influenza and/or RSV, then the patient/parent/guardian was advised to isolate at home pending the results of his/her diagnostic coronavirus test and potentially longer if they're positive. I have also advised pt that if his/her COVID-19 test returns positive, it's recommended to self-isolate for at least 10 days after symptoms first appeared AND until fever-free for 24 hours without fever reducer AND other symptoms have improved or resolved. Discussed self-isolation recommendations as well as instructions for household member/close contacts as per the [facility] and [place] DHHS, and also gave patient the COVID packet with this information.  Patient/parent/caregiver has been advised to return to the [facility] or PCP in 3-5 days if no better; to PCP or the Emergency Department if new signs and symptoms develop, or if the current signs  or symptoms continue to  change or worsen for further workup, evaluation and treatment as clinically indicated and appropriate  The patient will follow up with their current PCP if and as advised. If the patient does not currently have a PCP we will assist them in obtaining one.   The patient may need specialty follow up if the symptoms continue, in spite of conservative treatment and management, for further workup, evaluation, consultation and treatment as clinically indicated and appropriate.  Patient/parent/caregiver verbalized understanding and agreement of plan as discussed.  All questions were addressed during visit.  Please see discharge instructions below for further details of plan.  Discharge Instructions:   Discharge Instructions      You received a COVID-19 and influenza PCR test today.  The results of your PCR testing will be posted to your MyChart once it is complete.  This typically takes 6 to 12 hours.    If your influenza PCR test is positive, you will be contacted by phone.  Please complete full 5-day course of Tamiflu, a prescription will be provided for you.     If your COVID-19 PCR test is positive, you will be contacted by phone.  Please discuss with the callback nurse whether or not you would benefit from antiviral therapy treatment for COVID-19.    Please read below to learn more about the medications, dosages and frequencies that I recommend to help alleviate your symptoms and to get you feeling better soon:   Advil, Motrin (ibuprofen): This is a good anti-inflammatory medication which addresses aches, pains and inflammation of the upper airways that causes sinus and nasal congestion as well as in the lower airways which makes your cough feel tight and sometimes burn.  I recommend that you take between 400 to 600 mg every 6-8 hours as needed.      Tylenol (acetaminophen): This is a good fever reducer.  If your body temperature rises above 101.5 as measured with a thermometer, it is  recommended that you take 1,000 mg every 8 hours until your temperature falls below 101.5, please not take more than 3,000 mg of acetaminophen either as a separate medication or as in ingredient in an over-the-counter cold/flu preparation within a 24-hour period.      Robitussin, Mucinex (guaifenesin): This is an expectorant.  This helps break up chest congestion and loosen up thick nasal drainage making phlegm and drainage more liquid and therefore easier to remove.  I recommend being 400 mg three times daily as needed.      Dextromethorphan (any cough medicine with the letters "DM" added to it's name such as Robitussin DM): This is a cough suppressant.  This is often recommended to be taken at nighttime to suppress cough and help children sleep.  Give dosage as directed on the bottle.  This medication is available over-the-counter.   Chloraseptic Throat Spray: Spray 5 sprays into affected area every 2 hours, hold for 15 seconds and either swallow or spit it out.  This is a excellent numbing medication because it is a spray, you can put it right where you needed and so sucking on a lozenge and numbing your entire mouth.      Please follow-up within the next 5-7 days either with your primary care provider or urgent care if your symptoms do not resolve.  If you do not have a primary care provider, we will assist you in finding one.        Thank you for visiting urgent care today.  We appreciate the opportunity to participate in your care.         This office note has been dictated using Museum/gallery curator.  Unfortunately, this method of dictation can sometimes lead to typographical or grammatical errors.  I apologize for your inconvenience in advance if this occurs.  Please do not hesitate to reach out to me if clarification is needed.      Lynden Oxford Scales, PA-C 11/16/22 1056

## 2022-11-16 NOTE — ED Triage Notes (Addendum)
Pt reports having headache, body aches, sore throat, bilateral ear pain, congestion and cough that began this morning.    Home interventions: ibuprofen

## 2022-11-21 ENCOUNTER — Ambulatory Visit
Admission: EM | Admit: 2022-11-21 | Discharge: 2022-11-21 | Disposition: A | Discharge status: Home / Self Care | Payer: Self-pay | Attending: Emergency Medicine | Admitting: Emergency Medicine

## 2022-11-21 DIAGNOSIS — B9689 Other specified bacterial agents as the cause of diseases classified elsewhere: Secondary | ICD-10-CM

## 2022-11-21 DIAGNOSIS — J038 Acute tonsillitis due to other specified organisms: Secondary | ICD-10-CM

## 2022-11-21 DIAGNOSIS — H109 Unspecified conjunctivitis: Secondary | ICD-10-CM

## 2022-11-21 DIAGNOSIS — Z20818 Contact with and (suspected) exposure to other bacterial communicable diseases: Secondary | ICD-10-CM

## 2022-11-21 MED ORDER — PENICILLIN G BENZATHINE 1200000 UNIT/2ML IM SUSY
2.4000 10*6.[IU] | PREFILLED_SYRINGE | Freq: Once | INTRAMUSCULAR | Status: AC
  Administered 2022-11-21: 2.4 10*6.[IU] via INTRAMUSCULAR

## 2022-11-21 MED ORDER — CIPROFLOXACIN HCL 0.3 % OP SOLN: OPHTHALMIC | 0 refills | Status: DC

## 2022-11-21 MED ORDER — KETOROLAC TROMETHAMINE 30 MG/ML IJ SOLN
30.0000 mg | Freq: Once | INTRAMUSCULAR | Status: AC
  Administered 2022-11-21: 30 mg via INTRAMUSCULAR

## 2022-11-21 NOTE — ED Triage Notes (Addendum)
Pt c/o congestion, headache, left eye redness/drainage, left ear pain, and sore throat to left side.  Home interventions: motrin, tylenol, prescribed cough medication, sudafed

## 2022-11-21 NOTE — Discharge Instructions (Addendum)
Based on physical exam findings, I believe that you have developed a bacterial infection in your tonsils.  It is possible that this is due to your being exposed to a coworker with strep throat but I believe that is more likely due to opportunistic bacteria in the setting of viral infection.  I am sorry that you have been sick for so long, sometimes bacterial infections do not show themselves for many days after viral infections.  You were provided with an injection of penicillin today for empiric treatment of bacterial tonsillitis.  No further antibiotic treatment is needed.    You were also provided with an injection of ketorolac for your pain.  I anticipate that you will be feeling better in the next 12 to 24 hours.  For the bacterial infection in your left eye, I have sent a prescription for ciprofloxacin eyedrops to your pharmacy.  Please use them as prescribed.  You do not need to pick up this prescription right away.  You may also find that you have significant relief of the infection in your left eye just with the injection of penicillin alone.  Please let us know if you are not feeling any better in the next 24 to 48 hours.  Please go to the emergency room for further evaluation if you feel worse in the next 24 to 48 hours.  Thank you for visiting urgent care today.

## 2022-11-21 NOTE — ED Provider Notes (Signed)
[facility]    CSN: [id number] Arrival date & time: 11/21/22  1651    HISTORY   Chief Complaint  Patient presents with   Sore Throat   Headache   Conjunctivitis   HPI Mackenzie Dillon is a pleasant, 41 y.o. female who presents to urgent care today. Pt c/o congestion, headache, left eye redness/drainage, left ear pain, and sore throat to left side.   Home interventions: motrin, tylenol, prescribed cough medication, sudafed  Patient was initially seen here on December 6 and diagnosed with viral infection.  Patient went to [facility] emergency room where she was also diagnosed with viral infection.   Sore Throat Associated symptoms include headaches.  Headache Conjunctivitis Associated symptoms include headaches.   Past Medical History:  Diagnosis Date   Asthma    Atrial fibrillation (HCC)    Diabetes mellitus without complication (HCC)    Hypercholesterolemia    Hypertension    Plantar fasciitis    There are no problems to display for this patient.  History reviewed. No pertinent surgical history. OB History   No obstetric history on file.    Home Medications    Prior to Admission medications   Medication Sig Start Date End Date Taking? Authorizing Provider  atorvastatin (LIPITOR) 40 MG tablet Take 40 mg by mouth daily.    [provider]  escitalopram (LEXAPRO) 20 MG tablet Take 20 mg by mouth daily.    [provider]  lisinopril (ZESTRIL) 10 MG tablet Take 1 tablet by mouth daily. 05/18/20   [provider]  montelukast (SINGULAIR) 10 MG tablet Take 1 tablet by mouth daily. 03/02/20   [provider]    Family History History reviewed. No pertinent family history. Social History Social History   Tobacco Use   Smoking status: Never   Smokeless tobacco: Never  Vaping Use   Vaping Use: Never used  Substance Use Topics   Alcohol use: Never   Drug use: Never   Allergies   Sulfur, Hydrocodone, Metformin,  and Sulfa antibiotics  Review of Systems Review of Systems  Neurological:  Positive for headaches.   Pertinent findings revealed after performing a 14 point review of systems has been noted in the history of present illness.  Physical Exam Triage Vital Signs ED Triage Vitals  Enc Vitals Group     BP 10/08/21 0827 (!) 147/82     Pulse Rate 10/08/21 0827 72     Resp 10/08/21 0827 18     Temp 10/08/21 0827 98.3 F (36.8 C)     Temp Source 10/08/21 0827 Oral     SpO2 10/08/21 0827 98 %     Weight --      Height --      Head Circumference --      Peak Flow --      Pain Score 10/08/21 0826 5     Pain Loc --      Pain Edu? --      Excl. in GC? --   No data found.  Updated Vital Signs BP (!) 148/95 (BP Location: Left Arm)   Pulse 86   Temp 98.3 F (36.8 C) (Oral)   Resp 18   LMP 11/12/2022 (Approximate)   SpO2 96%   Physical Exam Constitutional:      General: She is not in acute distress.    Appearance: She is well-developed. She is ill-appearing. She is not toxic-appearing.  HENT:     Head: Normocephalic and atraumatic.  Salivary Glands: Right salivary gland is diffusely enlarged. Right salivary gland is not tender. Left salivary gland is diffusely enlarged and tender.     Comments: Swelling of left side along jawline    Right Ear: Hearing and external ear normal.     Left Ear: Hearing and external ear normal.     Ears:     Comments: Bilateral EACs with mild erythema, bilateral TMs are normal    Nose: Rhinorrhea present. No mucosal edema or congestion. Rhinorrhea is clear.     Right Turbinates: Not enlarged, swollen or pale.     Left Turbinates: Not enlarged or swollen.     Right Sinus: No maxillary sinus tenderness or frontal sinus tenderness.     Left Sinus: No maxillary sinus tenderness or frontal sinus tenderness.     Mouth/Throat:     Lips: Pink. No lesions.     Mouth: Mucous membranes are moist. No oral lesions or angioedema.     Dentition: No gingival  swelling.     Tongue: No lesions.     Palate: No mass.     Pharynx: Uvula midline. Pharyngeal swelling, oropharyngeal exudate, posterior oropharyngeal erythema and uvula swelling present.     Tonsils: Tonsillar exudate present. 2+ on the right. 4+ on the left.  Eyes:     General: Lids are normal. Vision grossly intact. No allergic shiner.    Extraocular Movements: Extraocular movements intact.     Conjunctiva/sclera:     Left eye: Left conjunctiva is injected. Exudate present.     Pupils: Pupils are equal, round, and reactive to light.  Neck:     Thyroid: No thyroid mass, thyromegaly or thyroid tenderness.     Trachea: Tracheal tenderness present. No abnormal tracheal secretions or tracheal deviation.     Comments: Voice is muffled Cardiovascular:     Rate and Rhythm: Normal rate and regular rhythm.     Pulses: Normal pulses.     Heart sounds: Normal heart sounds, S1 normal and S2 normal. No murmur heard.    No friction rub. No gallop.  Pulmonary:     Effort: Pulmonary effort is normal. No tachypnea, bradypnea, accessory muscle usage, prolonged expiration, respiratory distress or retractions.     Breath sounds: Normal breath sounds. No stridor, decreased air movement or transmitted upper airway sounds. No decreased breath sounds, wheezing, rhonchi or rales.  Abdominal:     General: Bowel sounds are normal.     Palpations: Abdomen is soft.     Tenderness: There is generalized abdominal tenderness. There is no right CVA tenderness, left CVA tenderness or rebound. Negative signs include Murphy's sign.     Hernia: No hernia is present.  Musculoskeletal:        General: No tenderness. Normal range of motion.     Cervical back: Full passive range of motion without pain, normal range of motion and neck supple. Edema (Left side below jawline) present.     Right lower leg: No edema.     Left lower leg: No edema.  Lymphadenopathy:     Cervical: Cervical adenopathy present.     Right  cervical: Superficial cervical adenopathy present.     Left cervical: Superficial cervical adenopathy present.  Skin:    General: Skin is warm and dry.     Findings: No erythema, lesion or rash.  Neurological:     General: No focal deficit present.     Mental Status: She is alert and oriented to person, place, and time. Mental  status is at baseline.  Psychiatric:        Mood and Affect: Mood normal.        Behavior: Behavior normal.        Thought Content: Thought content normal.        Judgment: Judgment normal.     Visual Acuity Right Eye Distance:   Left Eye Distance:   Bilateral Distance:    Right Eye Near:   Left Eye Near:    Bilateral Near:     UC Couse / Diagnostics / Procedures:     Radiology No results found.  Procedures Procedures (including critical care time) EKG  Pending results:  Labs Reviewed - No data to display  Medications Ordered in UC: Medications  penicillin g benzathine (BICILLIN LA) 1200000 UNIT/2ML injection 2.4 Million Units (has no administration in time range)  ketorolac (TORADOL) 30 MG/ML injection 30 mg (has no administration in time range)    UC Diagnoses / Final Clinical Impressions(s)   I have reviewed the triage vital signs and the nursing notes.  Pertinent labs & imaging results that were available during my care of the patient were reviewed by me and considered in my medical decision making (see chart for details).    Final diagnoses:  Exposure to Streptococcal pharyngitis  Acute bacterial tonsillitis  Bacterial conjunctivitis of left eye   *** Please see discharge instructions below for further details of plan of care as provided to patient. ED Prescriptions     Medication Sig Dispense Auth. Provider   ciprofloxacin (CILOXAN) 0.3 % ophthalmic solution Administer 1 drop, every 2 hours, while awake, for 2 days. Then 1 drop, every 4 hours, while awake, for the next 5 days. 5 mL Theadora Rama Scales, PA-C      PDMP not  reviewed this encounter.  Disposition Upon Discharge:  Condition: stable for discharge home Home: take medications as prescribed; routine discharge instructions as discussed; follow up as advised.  Patient presented with an acute illness with associated systemic symptoms and significant discomfort requiring urgent management. In my opinion, this is a condition that a prudent lay person (someone who possesses an average knowledge of health and medicine) may potentially expect to result in complications if not addressed urgently such as respiratory distress, impairment of bodily function or dysfunction of bodily organs.   Routine symptom specific, illness specific and/or disease specific instructions were discussed with the patient and/or caregiver at length.   As such, the patient has been evaluated and assessed, work-up was performed and treatment was provided in alignment with urgent care protocols and evidence based medicine.  Patient/parent/caregiver has been advised that the patient may require follow up for further testing and treatment if the symptoms continue in spite of treatment, as clinically indicated and appropriate.  If the patient was tested for COVID-19, Influenza and/or RSV, then the patient/parent/guardian was advised to isolate at home pending the results of his/her diagnostic coronavirus test and potentially longer if they're positive. I have also advised pt that if his/her COVID-19 test returns positive, it's recommended to self-isolate for at least 10 days after symptoms first appeared AND until fever-free for 24 hours without fever reducer AND other symptoms have improved or resolved. Discussed self-isolation recommendations as well as instructions for household member/close contacts as per the [facility] and [place] [facility], and also gave patient the COVID packet with this information.  Patient/parent/caregiver has been advised to return to the [facility] or PCP in 3-5 days if no better; to PCP or the  Emergency Department if new signs and symptoms develop, or if the current signs or symptoms continue to change or worsen for further workup, evaluation and treatment as clinically indicated and appropriate  The patient will follow up with their current PCP if and as advised. If the patient does not currently have a PCP we will assist them in obtaining one.   The patient may need specialty follow up if the symptoms continue, in spite of conservative treatment and management, for further workup, evaluation, consultation and treatment as clinically indicated and appropriate.  Patient/parent/caregiver verbalized understanding and agreement of plan as discussed.  All questions were addressed during visit.  Please see discharge instructions below for further details of plan.  Discharge Instructions:   Discharge Instructions      Based on physical exam findings, I believe that you have developed a bacterial infection in your tonsils.  It is possible that this is due to your being exposed to a coworker with strep throat but I believe that is more likely due to opportunistic bacteria in the setting of viral infection.  I am sorry that you have been sick for so long, sometimes bacterial infections do not show themselves for many days after viral infections.  You were provided with an injection of penicillin today for empiric treatment of bacterial tonsillitis.  No further antibiotic treatment is needed.    You were also provided with an injection of ketorolac for your pain.  I anticipate that you will be feeling better in the next 12 to 24 hours.  For the bacterial infection in your left eye, I have sent a prescription for ciprofloxacin eyedrops to your pharmacy.  Please use them as prescribed.  You do not need to pick up this prescription right away.  You may also find that you have significant relief of the infection in your left eye just with the injection of penicillin alone.  Please let us know if  you are not feeling any better in the next 24 to 48 hours.  Please go to the emergency room for further evaluation if you feel worse in the next 24 to 48 hours.  Thank you for visiting urgent care today.        This office note has been dictated using Teaching laboratory technician.  Unfortunately, this method of dictation can sometimes lead to typographical or grammatical errors.  I apologize for your inconvenience in advance if this occurs.  Please do not hesitate to reach out to me if clarification is needed.

## 2022-11-24 ENCOUNTER — Ambulatory Visit: Payer: Self-pay

## 2022-11-24 ENCOUNTER — Telehealth: Payer: Self-pay | Admitting: Family Medicine

## 2022-11-24 DIAGNOSIS — H10023 Other mucopurulent conjunctivitis, bilateral: Secondary | ICD-10-CM

## 2022-11-24 DIAGNOSIS — J029 Acute pharyngitis, unspecified: Secondary | ICD-10-CM

## 2022-11-24 MED ORDER — POLYMYXIN B-TRIMETHOPRIM 10000-0.1 UNIT/ML-% OP SOLN: 1.0000 [drp] | Freq: Four times a day (QID) | OPHTHALMIC | 0 refills | Status: DC

## 2022-11-24 MED ORDER — LIDOCAINE VISCOUS HCL 2 % MT SOLN: 15.0000 mL | OROMUCOSAL | 0 refills | Status: DC | PRN

## 2022-11-24 NOTE — Patient Instructions (Addendum)
Veleta Miners, thank you for joining Freddy Finner, NP for today's virtual visit.  While this provider is not your primary care provider (PCP), if your PCP is located in our provider database this encounter information will be shared with them immediately following your visit.   A [facility] MyChart account gives you access to today's visit and all your visits, tests, and labs performed at [facility] " click here if you don't have a [facility] MyChart account or go to [email or web address]  Consent: (Patient) Mackenzie Dillon provided verbal consent for this virtual visit at the beginning of the encounter.  Current Medications:  Current Outpatient Medications:    lidocaine (XYLOCAINE) 2 % solution, Use as directed 15 mLs in the mouth or throat as needed for mouth pain., Disp: 100 mL, Rfl: 0   trimethoprim-polymyxin b (POLYTRIM) ophthalmic solution, Place 1 drop into both eyes in the morning, at noon, in the evening, and at bedtime., Disp: 10 mL, Rfl: 0   atorvastatin (LIPITOR) 40 MG tablet, Take 40 mg by mouth daily., Disp: , Rfl:    ciprofloxacin (CILOXAN) 0.3 % ophthalmic solution, Administer 1 drop, every 2 hours, while awake, for 2 days. Then 1 drop, every 4 hours, while awake, for the next 5 days., Disp: 5 mL, Rfl: 0   escitalopram (LEXAPRO) 20 MG tablet, Take 20 mg by mouth daily., Disp: , Rfl:    lisinopril (ZESTRIL) 10 MG tablet, Take 1 tablet by mouth daily., Disp: , Rfl:    montelukast (SINGULAIR) 10 MG tablet, Take 1 tablet by mouth daily., Disp: , Rfl:    Medications ordered in this encounter:  Meds ordered this encounter  Medications   lidocaine (XYLOCAINE) 2 % solution    Sig: Use as directed 15 mLs in the mouth or throat as needed for mouth pain.    Dispense:  100 mL    Refill:  0    Please make generic for the pt is self pay    Order Specific Question:   Supervising Provider    Answer:   Merrilee Jansky [[id number]]   trimethoprim-polymyxin b  (POLYTRIM) ophthalmic solution    Sig: Place 1 drop into both eyes in the morning, at noon, in the evening, and at bedtime.    Dispense:  10 mL    Refill:  0    Order Specific Question:   Supervising Provider    Answer:   Merrilee Jansky [[id number]]     *If you need refills on other medications prior to your next appointment, please contact your pharmacy*  Follow-Up: Call back or seek an in-person evaluation if the symptoms worsen or if the condition fails to improve as anticipated.  [facility] [phone]  Other Instructions  Please follow up if throat continues to be sore, if you can't swallow well, or tolerate liquids.  Strep Throat, Adult Strep throat is an infection of the throat. It is caused by germs (bacteria). Strep throat is common during the cold months of the year. It mostly affects children who are 24-9 years old. However, people of all ages can get it at any time of the year. This infection spreads from person to person through coughing, sneezing, or having close contact. What are the causes? This condition is caused by the Streptococcus pyogenes germ. What increases the risk? You care for young children. Children are more likely to get strep throat and may spread it to others. You go to crowded places. Germs can spread  easily in such places. You kiss or touch someone who has strep throat. What are the signs or symptoms? Fever or chills. Redness, swelling, or pain in the tonsils or throat. Pain or trouble when swallowing. White or yellow spots on the tonsils or throat. Tender glands in the neck and under the jaw. Bad breath. Red rash all over the body. This is rare. How is this treated? Medicines that kill germs (antibiotics). Medicines that treat pain or fever. These include: Ibuprofen or acetaminophen. Aspirin, only for people who are over the age of 15. Cough drops. Throat sprays. Follow these instructions at home: Medicines  Take  over-the-counter and prescription medicines only as told by your doctor. Take your antibiotic medicine as told by your doctor. Do not stop taking the antibiotic even if you start to feel better. Eating and drinking  If you have trouble swallowing, eat soft foods until your throat feels better. Drink enough fluid to keep your pee (urine) pale yellow. To help with pain, you may have: Warm fluids, such as soup and tea. Cold fluids, such as frozen desserts or popsicles. General instructions Rinse your mouth (gargle) with a salt-water mixture 3-4 times a day or as needed. To make a salt-water mixture, dissolve -1 tsp (3-6 g) of salt in 1 cup (237 mL) of warm water. Rest as much as you can. Stay home from work or school until you have been taking antibiotics for 24 hours. Do not smoke or use any products that contain nicotine or tobacco. If you need help quitting, ask your doctor. Keep all follow-up visits. How is this prevented?  Do not share food, drinking cups, or personal items. They can cause the germs to spread. Wash your hands well with soap and water. Make sure that all people in your house wash their hands well. Have family members tested if they have a fever or a sore throat. They may need an antibiotic if they have strep throat. Contact a doctor if: You have swelling in your neck that keeps getting bigger. You get a rash, cough, or earache. You cough up a thick fluid that is green, yellow-brown, or bloody. You have pain that does not get better with medicine. Your symptoms get worse instead of getting better. You have a fever. Get help right away if: You vomit. You have a very bad headache. Your neck hurts or feels stiff. You have chest pain or are short of breath. You have drooling, very bad throat pain, or changes in your voice. Your neck is swollen, or the skin gets red and tender. Your mouth is dry, or you are peeing less than normal. You keep feeling more tired or have  trouble waking up. Your joints are red or painful. These symptoms may be an emergency. Do not wait to see if the symptoms will go away. Get help right away. Call your local emergency services (911 in the [place] Summary Strep throat is an infection of the throat. It is caused by germs (bacteria). This infection can spread from person to person through coughing, sneezing, or having close contact. Take your medicines, including antibiotics, as told by your doctor. Do not stop taking the antibiotic even if you start to feel better. To prevent the spread of germs, wash your hands well with soap and water. Have others do the same. Do not share food, drinking cups, or personal items. Get help right away if you have a bad headache, chest pain, shortness of breath, a stiff or painful  neck, or you vomit. This information is not intended to replace advice given to you by your health care provider. Make sure you discuss any questions you have with your health care provider. Document Revised: 03/23/2021 Document Reviewed: 03/23/2021 Elsevier Patient Education  2023 [employer]     If you have been instructed to have an in-person evaluation today at a local Urgent Care facility, please use the link below. It will take you to a list of all of our available [facility], including address, phone number and hours of operation. Please do not delay care.  [facility]  If you or a family member do not have a primary care provider, use the link below to schedule a visit and establish care. When you choose a [facility] primary care physician or advanced practice provider, you gain a long-term partner in health. Find a Primary Care Provider  Learn more about [facility]'s in-office and virtual care options: [facility] - Get Care Now

## 2022-11-24 NOTE — Progress Notes (Signed)
Virtual Visit Consent   Mackenzie Dillon, you are scheduled for a virtual visit with a [facility] provider today. Just as with appointments in the office, your consent must be obtained to participate. Your consent will be active for this visit and any virtual visit you may have with one of our providers in the next 365 days. If you have a MyChart account, a copy of this consent can be sent to you electronically.  As this is a virtual visit, video technology does not allow for your provider to perform a traditional examination. This may limit your provider's ability to fully assess your condition. If your provider identifies any concerns that need to be evaluated in person or the need to arrange testing (such as labs, EKG, etc.), we will make arrangements to do so. Although advances in technology are sophisticated, we cannot ensure that it will always work on either your end or our end. If the connection with a video visit is poor, the visit may have to be switched to a telephone visit. With either a video or telephone visit, we are not always able to ensure that we have a secure connection.  By engaging in this virtual visit, you consent to the provision of healthcare and authorize for your insurance to be billed (if applicable) for the services provided during this visit. Depending on your insurance coverage, you may receive a charge related to this service.  I need to obtain your verbal consent now. Are you willing to proceed with your visit today? Mackenzie Dillon has provided verbal consent on 11/24/2022 for a virtual visit (video or telephone). Freddy Finner, NP  Date: 11/24/2022 9:32 AM  Virtual Visit via Video Note   I, Freddy Finner, connected with  Mackenzie Dillon  ([id number], 1981/10/31) on 11/24/22 at  9:30 AM EST by a video-enabled telemedicine application and verified that I am speaking with the correct person using two identifiers.  Location: Patient: Virtual Visit Location Patient:  Home Provider: Virtual Visit Location Provider: Home Office   I discussed the limitations of evaluation and management by telemedicine and the availability of in person appointments. The patient expressed understanding and agreed to proceed.    History of Present Illness: Mackenzie Dillon is a 41 y.o. who identifies as a female who was assigned female at birth, and is being seen today for sore throat on the right now- was on left. Was treated for tonsillitis- was treated, noted improvement. But in last two days she has noted some increased in pain on the right, and cough some. No nasal congestion, pressure.  NSAID works some- but not enough, inability to sleep well.   Needs a different eye drop sent in for cost.  11/16/22- throat pain- followed up on 11/21/22 for tonsillitis.    Problems: There are no problems to display for this patient.   Allergies:  Allergies  Allergen Reactions   Sulfur     Other Reaction(s): Headache-Intolerance, Headache-Intolerance   Hydrocodone Itching   Metformin Other (See Comments)    GI discomfort   Sulfa Antibiotics    Medications:  Current Outpatient Medications:    atorvastatin (LIPITOR) 40 MG tablet, Take 40 mg by mouth daily., Disp: , Rfl:    ciprofloxacin (CILOXAN) 0.3 % ophthalmic solution, Administer 1 drop, every 2 hours, while awake, for 2 days. Then 1 drop, every 4 hours, while awake, for the next 5 days., Disp: 5 mL, Rfl: 0   escitalopram (LEXAPRO) 20 MG tablet, Take 20 mg by  mouth daily., Disp: , Rfl:    lisinopril (ZESTRIL) 10 MG tablet, Take 1 tablet by mouth daily., Disp: , Rfl:    montelukast (SINGULAIR) 10 MG tablet, Take 1 tablet by mouth daily., Disp: , Rfl:   Observations/Objective: Patient is well-developed, well-nourished in no acute distress.  Resting comfortably  at home.  Head is normocephalic, atraumatic.  No labored breathing.  Speech is clear and coherent with logical content.  Patient is alert and oriented at baseline.     Assessment and Plan:  1. Sore throat  - lidocaine (XYLOCAINE) 2 % solution; Use as directed 15 mLs in the mouth or throat as needed for mouth pain.  Dispense: 100 mL; Refill: 0  2. Pink eye disease of both eyes  - trimethoprim-polymyxin b (POLYTRIM) ophthalmic solution; Place 1 drop into both eyes in the morning, at noon, in the evening, and at bedtime.  Dispense: 10 mL; Refill: 0   -rest -hydrate -please use medications as directed -strict in person precautions reviewed   Reviewed side effects, risks and benefits of medication.    Patient acknowledged agreement and understanding of the plan.   Past Medical, Surgical, Social History, Allergies, and Medications have been Reviewed.    Follow Up Instructions: I discussed the assessment and treatment plan with the patient. The patient was provided an opportunity to ask questions and all were answered. The patient agreed with the plan and demonstrated an understanding of the instructions.  A copy of instructions were sent to the patient via MyChart unless otherwise noted below.     The patient was advised to call back or seek an in-person evaluation if the symptoms worsen or if the condition fails to improve as anticipated.  Time:  I spent 10 minutes with the patient via telehealth technology discussing the above problems/concerns.    Freddy Finner, NP

## 2022-12-12 ENCOUNTER — Telehealth: Payer: Self-pay | Admitting: Physician Assistant

## 2022-12-12 ENCOUNTER — Encounter: Payer: Self-pay | Admitting: Physician Assistant

## 2022-12-12 DIAGNOSIS — J020 Streptococcal pharyngitis: Secondary | ICD-10-CM

## 2022-12-12 DIAGNOSIS — B9689 Other specified bacterial agents as the cause of diseases classified elsewhere: Secondary | ICD-10-CM

## 2022-12-12 DIAGNOSIS — J019 Acute sinusitis, unspecified: Secondary | ICD-10-CM

## 2022-12-12 MED ORDER — PREDNISONE 20 MG PO TABS: 40.0000 mg | ORAL_TABLET | Freq: Every day | ORAL | 0 refills | Status: AC

## 2022-12-12 NOTE — Progress Notes (Signed)
Virtual Visit Consent   Mackenzie Dillon, you are scheduled for a virtual visit with a [facility] provider today. Just as with appointments in the office, your consent must be obtained to participate. Your consent will be active for this visit and any virtual visit you may have with one of our providers in the next 365 days. If you have a MyChart account, a copy of this consent can be sent to you electronically.  As this is a virtual visit, video technology does not allow for your provider to perform a traditional examination. This may limit your provider's ability to fully assess your condition. If your provider identifies any concerns that need to be evaluated in person or the need to arrange testing (such as labs, EKG, etc.), we will make arrangements to do so. Although advances in technology are sophisticated, we cannot ensure that it will always work on either your end or our end. If the connection with a video visit is poor, the visit may have to be switched to a telephone visit. With either a video or telephone visit, we are not always able to ensure that we have a secure connection.  By engaging in this virtual visit, you consent to the provision of healthcare and authorize for your insurance to be billed (if applicable) for the services provided during this visit. Depending on your insurance coverage, you may receive a charge related to this service.  I need to obtain your verbal consent now. Are you willing to proceed with your visit today? [name] has provided verbal consent on 12/12/2022 for a virtual visit (video or telephone). Mackenzie Dillon, [place]  Date: 12/12/2022 5:34 PM  Virtual Visit via Video Note   I, Mackenzie Dillon, connected with  [name]  ([id number], 1981/06/10) on 12/12/22 at  5:15 PM EST by a video-enabled telemedicine application and verified that I am speaking with the correct person using two identifiers.  Location: Patient: Virtual Visit Location  Patient: Mobile Provider: Virtual Visit Location Provider: Home Office   I discussed the limitations of evaluation and management by telemedicine and the availability of in person appointments. The patient expressed understanding and agreed to proceed.    History of Present Illness: Mackenzie Dillon is a 42 y.o. who identifies as a female who was assigned female at birth, and is being seen today for some ongoing URI symptoms after treatment for strep pharyngitis last month.  Was initially seen at urgent care at which time she was diagnosed with strep pharyngitis.  Was treated with IM penicillin and a Toradol injection.  Also had subsequent conjunctivitis and was giving an ophthalmic antibiotic drops for this.  Notes symptoms completely resolved.  Has had some residual sore throat, mainly left-sided, associated with sinus congestion, sinus pressure and pain.  Notes occasional odynophagia but denies dysphagia.  Denies noting any exudate on her tonsils.  Denies GI symptoms.  Is currently without PCP.[name]  HPI: HPI  Problems: There are no problems to display for this patient.   Allergies:  Allergies  Allergen Reactions   Sulfur     Other Reaction(s): Headache-Intolerance, Headache-Intolerance   Hydrocodone Itching   Metformin Other (See Comments)    GI discomfort   Sulfa Antibiotics    Medications:  Current Outpatient Medications:    atorvastatin (LIPITOR) 40 MG tablet, Take 40 mg by mouth daily., Disp: , Rfl:    escitalopram (LEXAPRO) 20 MG tablet, Take 20 mg by mouth daily., Disp: , Rfl:    lisinopril (ZESTRIL)  10 MG tablet, Take 1 tablet by mouth daily., Disp: , Rfl:    montelukast (SINGULAIR) 10 MG tablet, Take 1 tablet by mouth daily., Disp: , Rfl:   Observations/Objective: Patient is well-developed, well-nourished in no acute distress.  Resting comfortably at home.  Head is normocephalic, atraumatic.  No labored breathing. Speech is clear and coherent with logical content.  Patient is  alert and oriented at baseline.  Unable to fully visualize oropharynx through video due to quality.  Assessment and Plan: 1. Strep pharyngitis  2. Acute bacterial sinusitis  Concern for some lingering strep and also concern for festering sinusitis.  Supportive measures and OTC medications reviewed.  Will start azithromycin for treatment to cover both bases, giving treatment failure to penicillin.  Discussed if symptoms or not fully resolving, she will need a repeat evaluation in person for further testing.  Follow Up Instructions: I discussed the assessment and treatment plan with the patient. The patient was provided an opportunity to ask questions and all were answered. The patient agreed with the plan and demonstrated an understanding of the instructions.  A copy of instructions were sent to the patient via MyChart unless otherwise noted below.   The patient was advised to call back or seek an in-person evaluation if the symptoms worsen or if the condition fails to improve as anticipated.  Time:  I spent 10 minutes with the patient via telehealth technology discussing the above problems/concerns.    Mackenzie Rio, PA-C

## 2022-12-12 NOTE — Patient Instructions (Signed)
  Mackenzie Dillon, thank you for joining [name], PA-C for today's virtual visit.  While this provider is not your primary care provider (PCP), if your PCP is located in our provider database this encounter information will be shared with them immediately following your visit.   [facility] account gives you access to today's visit and all your visits, tests, and labs performed at [facility] " click here if you don't have a [facility] account or go to [email or web address]  Consent: (Patient) Mackenzie Dillon provided verbal consent for this virtual visit at the beginning of the encounter.  Current Medications:  Current Outpatient Medications:    atorvastatin (LIPITOR) 40 MG tablet, Take 40 mg by mouth daily., Disp: , Rfl:    ciprofloxacin (CILOXAN) 0.3 % ophthalmic solution, Administer 1 drop, every 2 hours, while awake, for 2 days. Then 1 drop, every 4 hours, while awake, for the next 5 days., Disp: 5 mL, Rfl: 0   escitalopram (LEXAPRO) 20 MG tablet, Take 20 mg by mouth daily., Disp: , Rfl:    lidocaine (XYLOCAINE) 2 % solution, Use as directed 15 mLs in the mouth or throat as needed for mouth pain., Disp: 100 mL, Rfl: 0   lisinopril (ZESTRIL) 10 MG tablet, Take 1 tablet by mouth daily., Disp: , Rfl:    montelukast (SINGULAIR) 10 MG tablet, Take 1 tablet by mouth daily., Disp: , Rfl:    trimethoprim-polymyxin b (POLYTRIM) ophthalmic solution, Place 1 drop into both eyes in the morning, at noon, in the evening, and at bedtime., Disp: 10 mL, Rfl: 0   Medications ordered in this encounter:  No orders of the defined types were placed in this encounter.    *If you need refills on other medications prior to your next appointment, please contact your pharmacy*  Follow-Up: Call back or seek an in-person evaluation if the symptoms worsen or if the condition fails to improve as anticipated.  [facility] [phone]  Other  Instructions Please keep well-hydrated and get plenty of rest. Okay to continue over-the-counter medications. Take the antibiotic and steroid as directed. If symptoms or not resolving or you note new or worsening symptoms despite treatment, you must be reevaluated in person.  Do not delay care.   If you have been instructed to have an in-person evaluation today at a local Urgent Care facility, please use the link below. It will take you to a list of all of our available [facility], including address, phone number and hours of operation. Please do not delay care.  [facility]  If you or a family member do not have a primary care provider, use the link below to schedule a visit and establish care. When you choose a [facility] primary care physician or advanced practice provider, you gain a long-term partner in health. Find a Primary Care Provider  Learn more about [facility]'s in-office and virtual care options: [facility]

## 2023-02-11 ENCOUNTER — Ambulatory Visit
Admission: EM | Admit: 2023-02-11 | Discharge: 2023-02-11 | Disposition: A | Discharge status: Home / Self Care | Payer: BLUE CROSS/BLUE SHIELD | Attending: Urgent Care | Admitting: Urgent Care

## 2023-02-11 DIAGNOSIS — U071 COVID-19: Secondary | ICD-10-CM | POA: Diagnosis not present

## 2023-02-11 DIAGNOSIS — I10 Essential (primary) hypertension: Secondary | ICD-10-CM | POA: Diagnosis not present

## 2023-02-11 MED ORDER — CETIRIZINE HCL 10 MG PO TABS: 10.0000 mg | ORAL_TABLET | Freq: Every day | ORAL | 0 refills | Status: AC

## 2023-02-11 MED ORDER — PSEUDOEPHEDRINE HCL 30 MG PO TABS: 30.0000 mg | ORAL_TABLET | Freq: Three times a day (TID) | ORAL | 0 refills | Status: AC | PRN

## 2023-02-11 MED ORDER — PROMETHAZINE-DM 6.25-15 MG/5ML PO SYRP: 5.0000 mL | ORAL_SOLUTION | Freq: Three times a day (TID) | ORAL | 0 refills | Status: AC | PRN

## 2023-02-11 MED ORDER — PAXLOVID (300/100) 20 X 150 MG & 10 X 100MG PO TBPK: ORAL_TABLET | ORAL | 0 refills | Status: AC

## 2023-02-11 NOTE — ED Triage Notes (Addendum)
Pt reports flu sx started last night-had +covid work test this am-NAD-steady gait

## 2023-02-11 NOTE — ED Provider Notes (Signed)
[facility] URGENT [facility]  Note:  This document was prepared using Systems analyst and may include unintentional dictation errors.  MRN: [id number] DOB: 11-15-1981  Subjective:   Eliz Vitatoe is a 42 y.o. female presenting for 1 day history of chills, runny and stuffy nose, coughing. No chest pain, shob, wheezing. Patient states she does not have asthma despite the diagnosis in her chart. States the same for diabetes. Reports she is borderline. No smoking of any kind including cigarettes, cigars, vaping, marijuana use.  Patient's employer is requiring a COVID test.  She tested herself using the same rapid test that the employer uses and they were both positive.  Reports that she has previously taken Paxlovid without any issues.  No current facility-administered medications for this encounter.  Current Outpatient Medications:    atorvastatin (LIPITOR) 40 MG tablet, Take 40 mg by mouth daily., Disp: , Rfl:    escitalopram (LEXAPRO) 20 MG tablet, Take 20 mg by mouth daily., Disp: , Rfl:    lisinopril (ZESTRIL) 10 MG tablet, Take 1 tablet by mouth daily., Disp: , Rfl:    montelukast (SINGULAIR) 10 MG tablet, Take 1 tablet by mouth daily., Disp: , Rfl:    predniSONE (DELTASONE) 20 MG tablet, Take 2 tablets (40 mg total) by mouth daily with breakfast., Disp: 6 tablet, Rfl: 0   Allergies  Allergen Reactions   Sulfur     Other Reaction(s): Headache-Intolerance, Headache-Intolerance   Hydrocodone Itching   Metformin Other (See Comments)    GI discomfort   Sulfa Antibiotics     Past Medical History:  Diagnosis Date   Asthma    Atrial fibrillation ([place])    Diabetes mellitus without complication ([place])    Hypercholesterolemia    Hypertension    Plantar fasciitis      History reviewed. No pertinent surgical history.  No family history on file.  Social History   Tobacco Use   Smoking status: Never   Smokeless tobacco: Never  Vaping Use   Vaping Use:  Never used  Substance Use Topics   Alcohol use: Never   Drug use: Never    ROS   Objective:   Vitals: BP 132/89 (BP Location: Right Arm)   Pulse 79   Temp 99.8 F (37.7 C) (Oral)   Resp 20   LMP 02/01/2023   SpO2 95%   Physical Exam Constitutional:      General: She is not in acute distress.    Appearance: Normal appearance. She is well-developed and normal weight. She is not ill-appearing, toxic-appearing or diaphoretic.  HENT:     Head: Normocephalic and atraumatic.     Right Ear: Tympanic membrane, ear canal and external ear normal. No drainage or tenderness. No middle ear effusion. There is no impacted cerumen. Tympanic membrane is not erythematous or bulging.     Left Ear: Tympanic membrane, ear canal and external ear normal. No drainage or tenderness.  No middle ear effusion. There is no impacted cerumen. Tympanic membrane is not erythematous or bulging.     Nose: Nose normal. No congestion or rhinorrhea.     Mouth/Throat:     Mouth: Mucous membranes are moist. No oral lesions.     Pharynx: No pharyngeal swelling, oropharyngeal exudate, posterior oropharyngeal erythema or uvula swelling.     Tonsils: No tonsillar exudate or tonsillar abscesses.  Eyes:     General: No scleral icterus.       Right eye: No discharge.  Left eye: No discharge.     Extraocular Movements: Extraocular movements intact.     Right eye: Normal extraocular motion.     Left eye: Normal extraocular motion.     Conjunctiva/sclera: Conjunctivae normal.  Cardiovascular:     Rate and Rhythm: Normal rate and regular rhythm.     Heart sounds: Normal heart sounds. No murmur heard.    No friction rub. No gallop.  Pulmonary:     Effort: Pulmonary effort is normal. No respiratory distress.     Breath sounds: No stridor. No wheezing, rhonchi or rales.  Chest:     Chest wall: No tenderness.  Musculoskeletal:     Cervical back: Normal range of motion and neck supple.  Lymphadenopathy:      Cervical: No cervical adenopathy.  Skin:    General: Skin is warm and dry.  Neurological:     General: No focal deficit present.     Mental Status: She is alert and oriented to person, place, and time.  Psychiatric:        Mood and Affect: Mood normal.        Behavior: Behavior normal.     Assessment and Plan :   PDMP not reviewed this encounter.  1. Clinical diagnosis of COVID-19   2. Essential hypertension     COVID confirmation testing pending.  Recommended starting Paxlovid for management of her COVID-19 infection.  Use supportive care otherwise.  Deferred imaging given clear cardiopulmonary exam, hemodynamically stable vital signs.  Counseled patient on potential for adverse effects with medications prescribed/recommended today, ER and return-to-clinic precautions discussed, patient verbalized understanding.    Jaynee Eagles, [place] 02/11/23 1036

## 2023-02-13 LAB — SARS CORONAVIRUS 2 (TAT 6-24 HRS): SARS Coronavirus 2: POSITIVE — AB

## 2023-02-17 ENCOUNTER — Ambulatory Visit
Admission: EM | Admit: 2023-02-17 | Discharge: 2023-02-17 | Disposition: A | Discharge status: Home / Self Care | Payer: BLUE CROSS/BLUE SHIELD | Attending: Nurse Practitioner | Admitting: Nurse Practitioner

## 2023-02-17 DIAGNOSIS — R051 Acute cough: Secondary | ICD-10-CM | POA: Insufficient documentation

## 2023-02-17 DIAGNOSIS — Z1152 Encounter for screening for COVID-19: Secondary | ICD-10-CM | POA: Insufficient documentation

## 2023-02-17 NOTE — Discharge Instructions (Signed)
The clinic will contact you with results of the COVID testing if it is positive Follow-up with your PCP if symptoms do not improve Please go to the ER for any worsening symptoms

## 2023-02-17 NOTE — ED Provider Notes (Signed)
[facility]    CSN: [id number] Arrival date & time: 02/17/23  1832      History   Chief Complaint Chief Complaint  Patient presents with   covid testing    Cough    HPI Mackenzie Dillon is a 42 y.o. female presents for COVID testing.  Patient was seen in urgent care on 3/2 and had a positive COVID PCR.  She states all her symptoms have resolved with the exception of a mild cough and some slight nasal congestion.  No fevers.  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  She needs a negative COVID test to return.  She has no other concerns at this time.   Cough   Past Medical History:  Diagnosis Date   Asthma    Atrial fibrillation ([place])    Diabetes mellitus without complication ([place])    Hypercholesterolemia    Hypertension    Plantar fasciitis     There are no problems to display for this patient.   History reviewed. No pertinent surgical history.  OB History   No obstetric history on file.      Home Medications    Prior to Admission medications   Medication Sig Start Date End Date Taking? Authorizing Provider  atorvastatin (LIPITOR) 40 MG tablet Take 40 mg by mouth daily.    [provider]  cetirizine (ZYRTEC ALLERGY) 10 MG tablet Take 1 tablet (10 mg total) by mouth daily. 02/11/23   Jaynee Eagles, PA-C  escitalopram (LEXAPRO) 20 MG tablet Take 20 mg by mouth daily.    [provider]  lisinopril (ZESTRIL) 10 MG tablet Take 1 tablet by mouth daily. 05/18/20   [provider]  montelukast (SINGULAIR) 10 MG tablet Take 1 tablet by mouth daily. 03/02/20   [provider]  nirmatrelvir & ritonavir (PAXLOVID, 300/100,) 20 x 150 MG & 10 x '100MG'$  TBPK Take 2 tablets nirmtrelvir and 1 tablet ritonavir twice daily. 02/11/23   Jaynee Eagles, PA-C  predniSONE (DELTASONE) 20 MG tablet Take 2 tablets (40 mg total) by mouth daily with breakfast. 12/12/22   Brunetta Jeans, PA-C  promethazine-dextromethorphan  (PROMETHAZINE-DM) 6.25-15 MG/5ML syrup Take 5 mLs by mouth 3 (three) times daily as needed for cough. 02/11/23   Jaynee Eagles, PA-C  pseudoephedrine (SUDAFED) 30 MG tablet Take 1 tablet (30 mg total) by mouth every 8 (eight) hours as needed for congestion. 02/11/23   Jaynee Eagles, PA-C    Family History History reviewed. No pertinent family history.  Social History Social History   Tobacco Use   Smoking status: Never   Smokeless tobacco: Never  Vaping Use   Vaping Use: Never used  Substance Use Topics   Alcohol use: Never   Drug use: Never     Allergies   Sulfur, Hydrocodone, Metformin, and Sulfa antibiotics   Review of Systems Review of Systems  Respiratory:  Positive for cough.      Physical Exam Triage Vital Signs ED Triage Vitals  Enc Vitals Group     BP 02/17/23 1848 (!) 133/91     Pulse Rate 02/17/23 1848 83     Resp 02/17/23 1848 18     Temp 02/17/23 1848 98.4 F (36.9 C)     Temp Source 02/17/23 1848 Oral     SpO2 02/17/23 1848 95 %     Weight --      Height --      Head  Circumference --      Peak Flow --      Pain Score 02/17/23 1854 0     Pain Loc --      Pain Edu? --      Excl. in [place]? --    No data found.  Updated Vital Signs BP (!) 133/91 (BP Location: Left Arm)   Pulse 83   Temp 98.4 F (36.9 C) (Oral)   Resp 18   LMP 02/01/2023   SpO2 95%   Visual Acuity Right Eye Distance:   Left Eye Distance:   Bilateral Distance:    Right Eye Near:   Left Eye Near:    Bilateral Near:     Physical Exam Vitals and nursing note reviewed.  Constitutional:      General: She is not in acute distress.    Appearance: She is well-developed. She is not ill-appearing.  HENT:     Head: Normocephalic and atraumatic.     Right Ear: Tympanic membrane and ear canal normal.     Left Ear: Tympanic membrane and ear canal normal.     Nose: Congestion present.     Mouth/Throat:     Mouth: Mucous membranes are moist.     Pharynx: Oropharynx is clear. Uvula  midline.     Tonsils: No tonsillar exudate or tonsillar abscesses.  Eyes:     Conjunctiva/sclera: Conjunctivae normal.     Pupils: Pupils are equal, round, and reactive to light.  Cardiovascular:     Rate and Rhythm: Normal rate and regular rhythm.     Heart sounds: Normal heart sounds.  Pulmonary:     Effort: Pulmonary effort is normal.     Breath sounds: Normal breath sounds.  Musculoskeletal:     Cervical back: Normal range of motion and neck supple.  Lymphadenopathy:     Cervical: No cervical adenopathy.  Skin:    General: Skin is warm and dry.  Neurological:     General: No focal deficit present.     Mental Status: She is alert and oriented to person, place, and time.  Psychiatric:        Mood and Affect: Mood normal.        Behavior: Behavior normal.      UC Treatments / Results  Labs (all labs ordered are listed, but only abnormal results are displayed) Labs Reviewed  SARS CORONAVIRUS 2 (TAT 6-24 HRS)    EKG   Radiology No results found.  Procedures Procedures (including critical care time)  Medications Ordered in UC Medications - No data to display  Initial Impression / Assessment and Plan / UC Course  I have reviewed the triage vital signs and the nursing notes.  Pertinent labs & imaging results that were available during my care of the patient were reviewed by me and considered in my medical decision making (see chart for details).     Reviewed with patient that you can test positive for COVID for up to 3 months after initial test regardless of symptoms.  She verbalized understanding COVID PCR and will contact if positive Follow-up with PCP if symptoms do not improve ER precautions reviewed and patient verbalized understanding Final Clinical Impressions(s) / UC Diagnoses   Final diagnoses:  Encounter for screening for COVID-19  Acute cough     Discharge Instructions      The clinic will contact you with results of the COVID testing if it  is positive Follow-up with your PCP if symptoms do not improve Please  go to the ER for any worsening symptoms   ED Prescriptions   None    PDMP not reviewed this encounter.   Melynda Ripple, NP 02/17/23 1905

## 2023-02-17 NOTE — ED Triage Notes (Signed)
Pt requesting covid testing, she states she recently tested positive. The patient states she does have a residual cough.

## 2023-02-18 LAB — SARS CORONAVIRUS 2 (TAT 6-24 HRS): SARS Coronavirus 2: NEGATIVE

## 2023-03-08 IMAGING — CT CT HEAD W/O CM
4 series · 17 of 47 positions shown, 19 images · non-contrast
Comparison: February 17, 2022

CLINICAL DATA: Headache and elevated blood pressure.



[Series 2: head wo · axial · 0.42mm/px · z∈[+1105,+1225]mm · 7 of 32 slices shown, 9 images]
[im 4/32  brain]
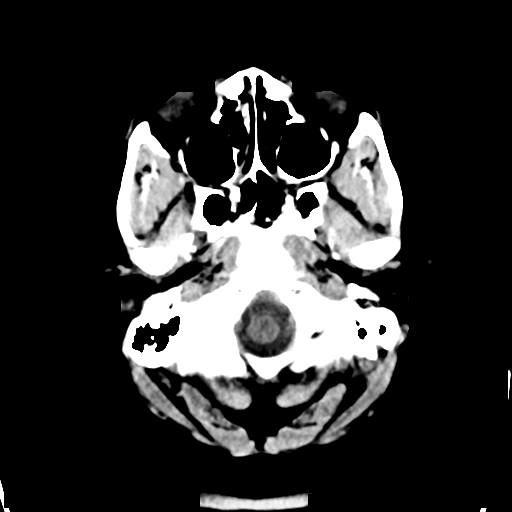
[im 4/32  bone]
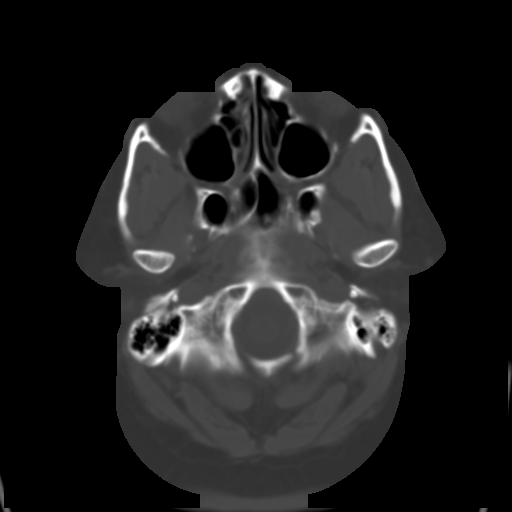
[im 8/32  brain]
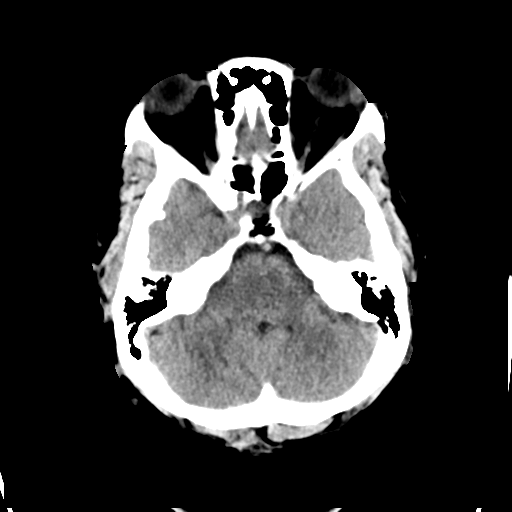
[im 12/32  brain]
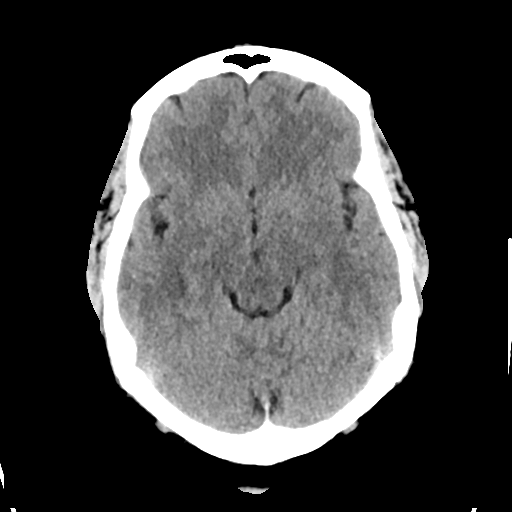
[im 16/32  brain]
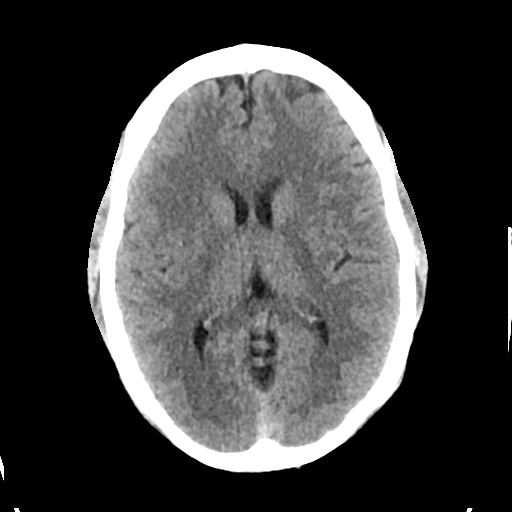
[im 20/32  brain]
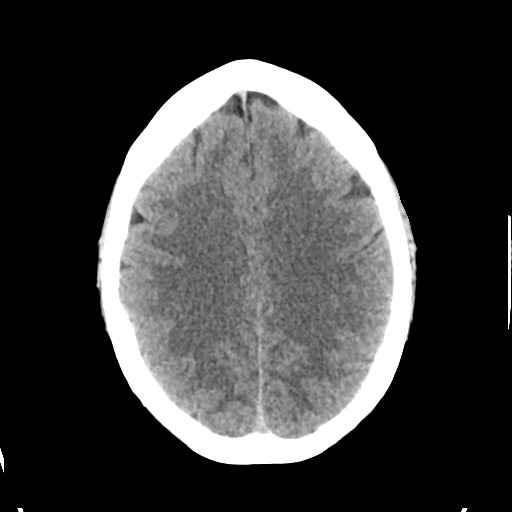
[im 20/32  bone]
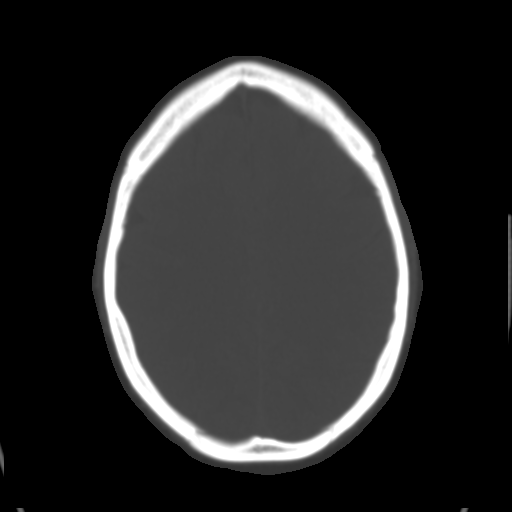
[im 24/32  brain]
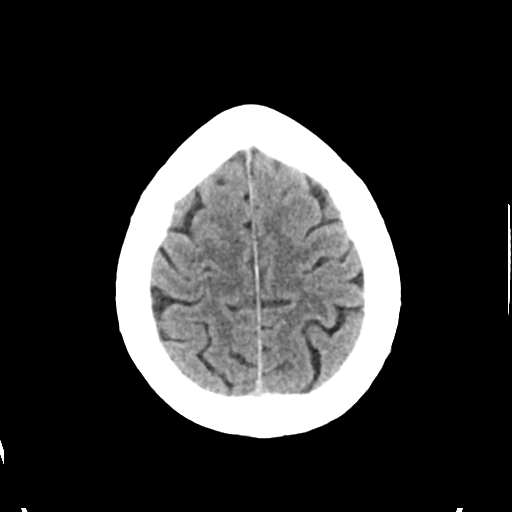
[im 28/32  brain]
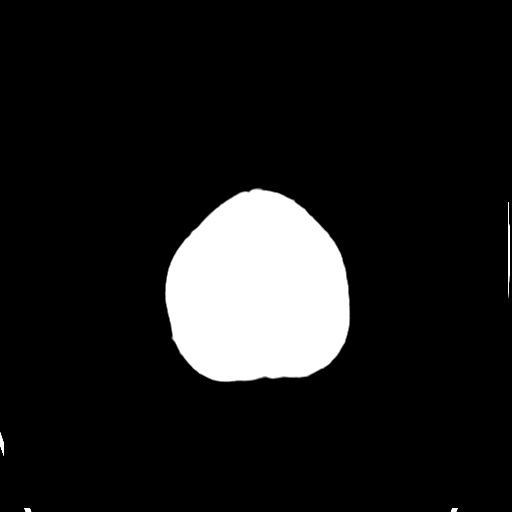

[Series 3: head bone · axial · 0.42mm/px · z∈[+1104,+1160]mm · 4 of 80 slices shown]
[im 8/80  bone]
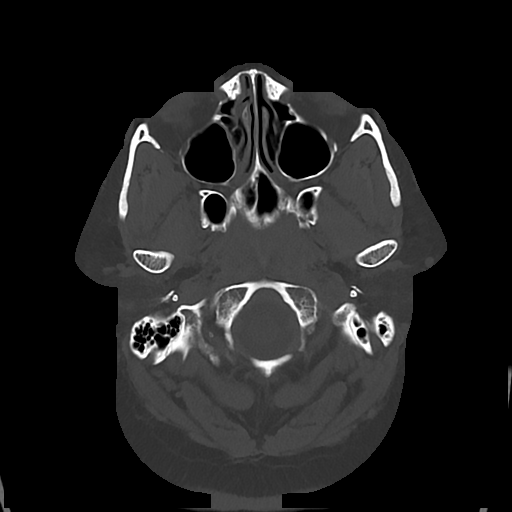
[im 16/80  bone]
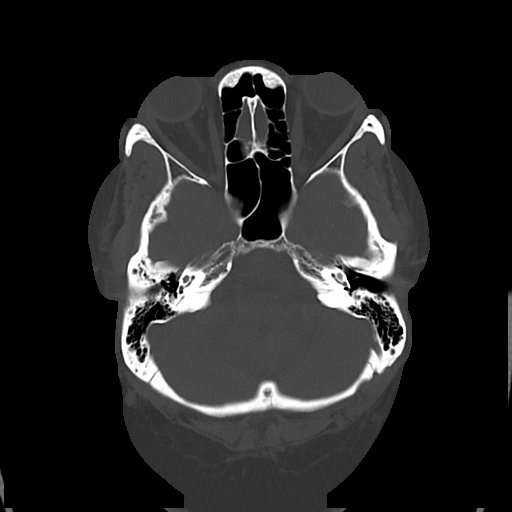
[im 24/80  bone]
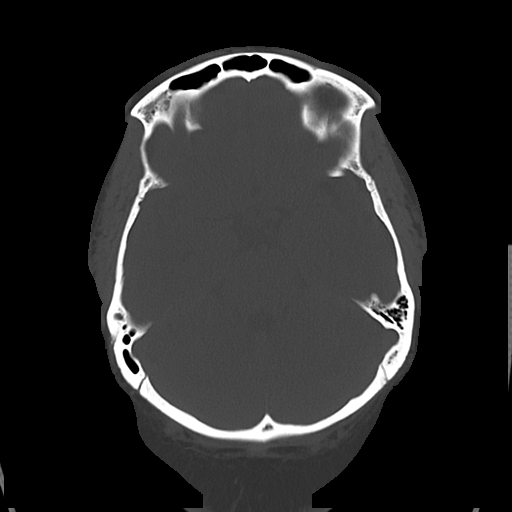
[im 36/80  bone]
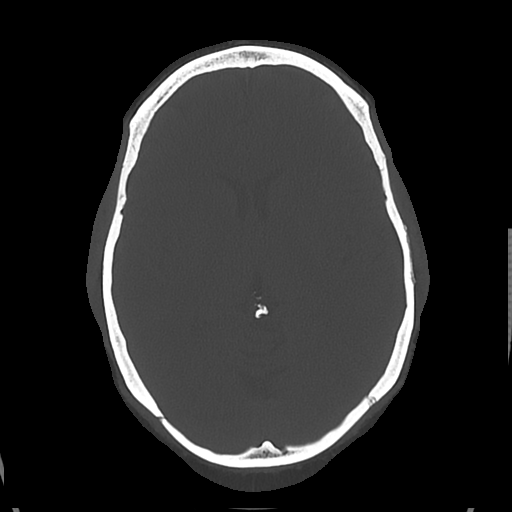

[Series 4: cor soft · coronal · 0.33mm/px · 3 of 71 slices shown]
[im 24/71  brain]
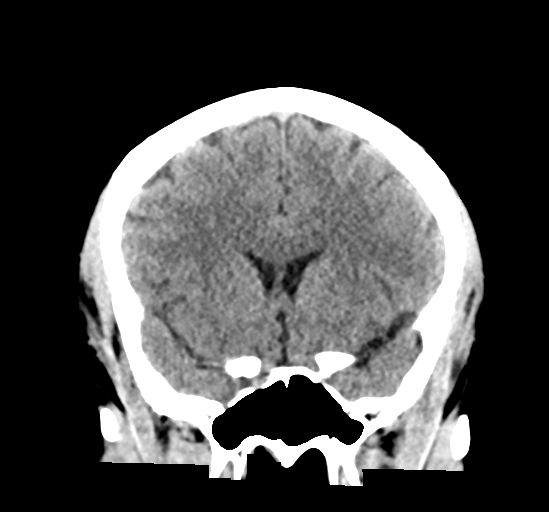
[im 32/71  brain]
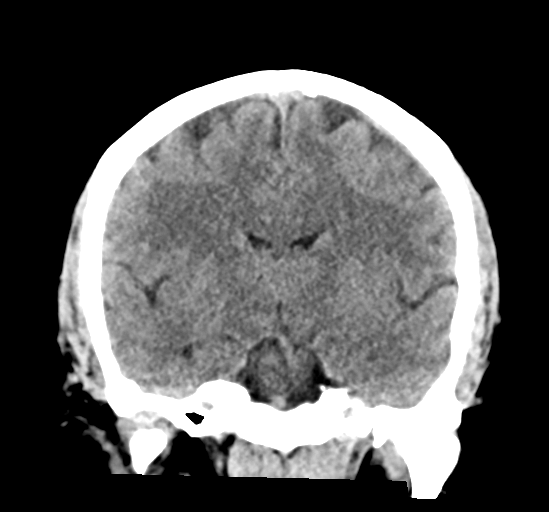
[im 39/71  brain]
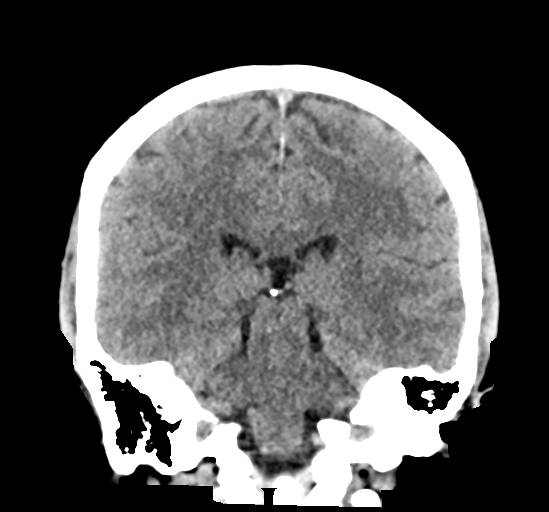

[Series 5: sag soft · sagittal · 0.33mm/px · 3 of 61 slices shown]
[im 21/61  brain]
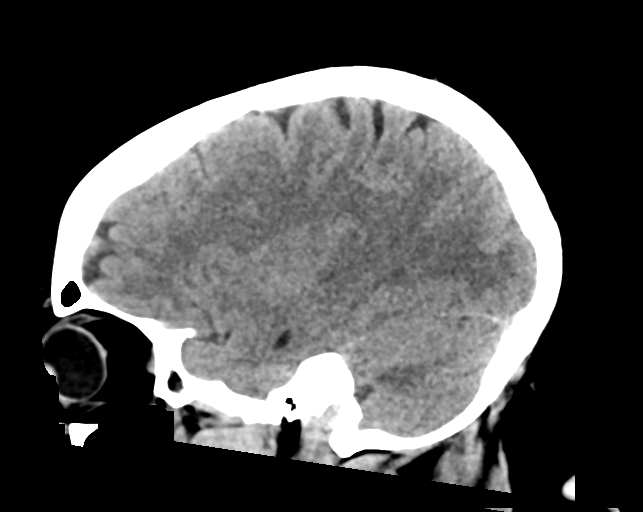
[im 31/61  brain]
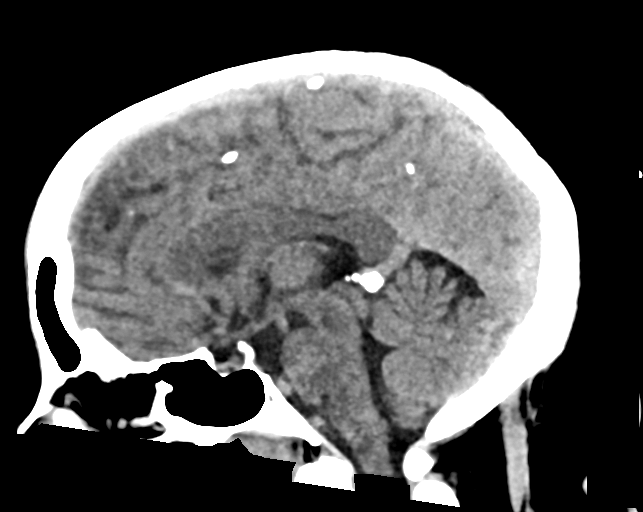
[im 41/61  brain]
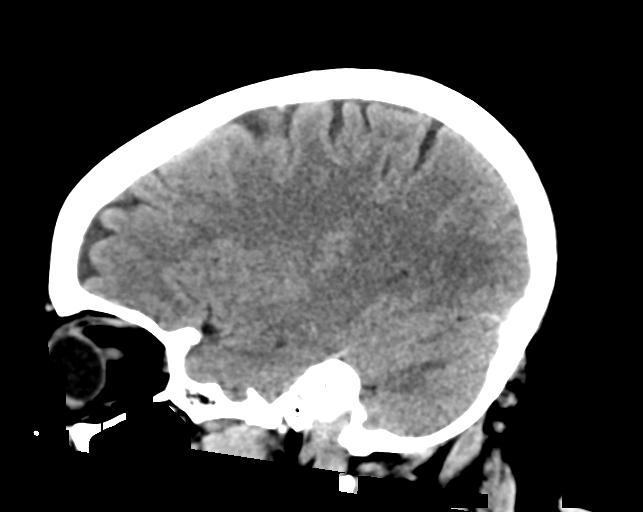

[17 of 47 positions shown; findings below may reference images not displayed]

FINDINGS: Brain: No evidence of acute infarction, hemorrhage, hydrocephalus,
extra-axial collection or mass lesion/mass effect.

Vascular: No hyperdense vessel or unexpected calcification.

Skull: Normal. Negative for fracture or focal lesion.

Sinuses/Orbits: No acute finding.

Other: None.
IMPRESSION: No acute intracranial pathology.

## 2023-06-27 ENCOUNTER — Other Ambulatory Visit: Payer: Self-pay

## 2023-06-28 ENCOUNTER — Other Ambulatory Visit: Payer: Self-pay

## 2023-06-28 MED ORDER — WEGOVY 0.25 MG/0.5ML ~~LOC~~ SOAJ
SUBCUTANEOUS | 0 refills | Status: DC
  Filled 2023-06-28: qty 2, 28d supply, fill #0

## 2023-06-29 ENCOUNTER — Other Ambulatory Visit (HOSPITAL_COMMUNITY): Payer: Self-pay

## 2023-06-29 ENCOUNTER — Other Ambulatory Visit: Payer: Self-pay

## 2023-07-03 ENCOUNTER — Other Ambulatory Visit: Payer: Self-pay

## 2023-07-04 ENCOUNTER — Other Ambulatory Visit: Payer: Self-pay

## 2023-07-12 ENCOUNTER — Other Ambulatory Visit: Payer: Self-pay

## 2023-08-01 ENCOUNTER — Other Ambulatory Visit: Payer: Self-pay

## 2023-08-02 ENCOUNTER — Other Ambulatory Visit: Payer: Self-pay

## 2023-08-10 ENCOUNTER — Other Ambulatory Visit: Payer: Self-pay

## 2023-08-25 ENCOUNTER — Other Ambulatory Visit: Payer: Self-pay

## 2023-12-08 ENCOUNTER — Other Ambulatory Visit: Payer: Self-pay

## 2023-12-08 MED ORDER — GUAIFENESIN ER 600 MG PO TB12: ORAL_TABLET | ORAL | 0 refills | Status: AC

## 2023-12-08 MED ORDER — BENZONATATE 100 MG PO CAPS
100.0000 mg | ORAL_CAPSULE | Freq: Three times a day (TID) | ORAL | 0 refills | Status: AC | PRN
  Filled 2023-12-08: qty 30, 10d supply, fill #0

## 2023-12-08 MED ORDER — CETIRIZINE HCL 10 MG PO TABS
10.0000 mg | ORAL_TABLET | Freq: Every day | ORAL | 0 refills | Status: AC
  Filled 2023-12-08: qty 90, 90d supply, fill #0

## 2023-12-08 MED ORDER — FLUTICASONE PROPIONATE 50 MCG/ACT NA SUSP
1.0000 | Freq: Every day | NASAL | 0 refills | Status: AC
  Filled 2023-12-08: qty 16, 60d supply, fill #0

## 2023-12-25 ENCOUNTER — Other Ambulatory Visit: Payer: Self-pay

## 2023-12-25 MED ORDER — TRIAMCINOLONE ACETONIDE 0.1 % EX OINT
1.0000 | TOPICAL_OINTMENT | Freq: Two times a day (BID) | CUTANEOUS | 0 refills | Status: DC
  Filled 2023-12-25: qty 30, 30d supply, fill #0

## 2024-02-05 ENCOUNTER — Other Ambulatory Visit: Payer: Self-pay

## 2024-02-05 MED ORDER — TRIAMCINOLONE ACETONIDE 0.1 % EX CREA
1.0000 | TOPICAL_CREAM | Freq: Three times a day (TID) | CUTANEOUS | 0 refills | Status: AC
  Filled 2024-02-05: qty 15, 30d supply, fill #0

## 2024-02-05 MED ORDER — SUMATRIPTAN SUCCINATE 50 MG PO TABS
50.0000 mg | ORAL_TABLET | ORAL | 0 refills | Status: AC
  Filled 2024-02-05: qty 9, 30d supply, fill #0

## 2024-02-05 MED ORDER — AMOXICILLIN-POT CLAVULANATE 875-125 MG PO TABS
1.0000 | ORAL_TABLET | Freq: Two times a day (BID) | ORAL | 0 refills | Status: AC
  Filled 2024-02-05: qty 20, 10d supply, fill #0

## 2024-02-14 ENCOUNTER — Other Ambulatory Visit: Payer: Self-pay

## 2024-02-14 ENCOUNTER — Emergency Department (HOSPITAL_BASED_OUTPATIENT_CLINIC_OR_DEPARTMENT_OTHER)
Admission: EM | Admit: 2024-02-14 | Discharge: 2024-02-15 | Disposition: A | Discharge status: Home / Self Care | Attending: Emergency Medicine | Admitting: Emergency Medicine

## 2024-02-14 ENCOUNTER — Encounter (HOSPITAL_BASED_OUTPATIENT_CLINIC_OR_DEPARTMENT_OTHER): Payer: Self-pay

## 2024-02-14 DIAGNOSIS — R1013 Epigastric pain: Secondary | ICD-10-CM | POA: Insufficient documentation

## 2024-02-14 DIAGNOSIS — R112 Nausea with vomiting, unspecified: Secondary | ICD-10-CM | POA: Insufficient documentation

## 2024-02-14 LAB — RESP PANEL BY RT-PCR (RSV, FLU A&B, COVID)  RVPGX2
Influenza A by PCR: NEGATIVE
Influenza B by PCR: NEGATIVE
Resp Syncytial Virus by PCR: NEGATIVE
SARS Coronavirus 2 by RT PCR: NEGATIVE

## 2024-02-14 MED ORDER — ONDANSETRON HCL 4 MG/2ML IJ SOLN
4.0000 mg | Freq: Once | INTRAMUSCULAR | Status: AC
  Administered 2024-02-15: 4 mg via INTRAVENOUS
  Filled 2024-02-14: qty 2

## 2024-02-14 MED ORDER — SODIUM CHLORIDE 0.9 % IV BOLUS
1000.0000 mL | Freq: Once | INTRAVENOUS | Status: AC
  Administered 2024-02-15: 1000 mL via INTRAVENOUS

## 2024-02-14 NOTE — ED Provider Notes (Signed)
  [place] [facility] AT [facility] Provider Note   CSN: [id number] Arrival date & time: 02/14/24  2130     History {Add pertinent medical, surgical, social history, OB history to HPI:1} Chief Complaint  Patient presents with   Generalized Body Aches    Mackenzie Dillon is a 43 y.o. female.  43 yo F with a chief complaint of nausea and vomiting.  Going on since last night.  She works in Runner, broadcasting/film/video.  She has been around people that are sick.  She does not know if anyone in particular has a similar illness that she does.  Denies diarrhea.  Having some epigastric discomfort with this.        Home Medications Prior to Admission medications   Medication Sig Start Date End Date Taking? Authorizing Provider  amoxicillin-clavulanate (AUGMENTIN) 875-125 MG tablet Take 1 tablet by mouth 2 (two) times daily for 10 days. 02/05/24 02/15/24    atorvastatin (LIPITOR) 40 MG tablet Take 40 mg by mouth daily.    [provider]  benzonatate (TESSALON) 100 MG capsule Take 1 capsule (100 mg total) by mouth 3 (three) times daily as needed. 12/07/23     cetirizine (ZYRTEC ALLERGY) 10 MG tablet Take 1 tablet (10 mg total) by mouth daily. 02/11/23   Wallis Bamberg, PA-C  cetirizine (ZYRTEC) 10 MG tablet Take 1 tablet (10 mg total) by mouth daily. 12/07/23     escitalopram (LEXAPRO) 20 MG tablet Take 20 mg by mouth daily.    [provider]  fluticasone (FLONASE) 50 MCG/ACT nasal spray Place 1 spray into both nostrils daily. 12/07/23     guaiFENesin (MUCINEX) 600 MG 12 hr tablet Take 1 tablet (600 mg total) by mouth 2 (two) times a day for 10 days. 12/07/23     lisinopril (ZESTRIL) 10 MG tablet Take 1 tablet by mouth daily. 05/18/20   [provider]  montelukast (SINGULAIR) 10 MG tablet Take 1 tablet by mouth daily. 03/02/20   [provider]  nirmatrelvir & ritonavir (PAXLOVID, 300/100,) 20 x 150 MG & 10 x 100MG  TBPK Take 2 tablets nirmtrelvir and 1 tablet  ritonavir twice daily. 02/11/23   Wallis Bamberg, PA-C  predniSONE (DELTASONE) 20 MG tablet Take 2 tablets (40 mg total) by mouth daily with breakfast. 12/12/22   Waldon Merl, PA-C  promethazine-dextromethorphan (PROMETHAZINE-DM) 6.25-15 MG/5ML syrup Take 5 mLs by mouth 3 (three) times daily as needed for cough. 02/11/23   Wallis Bamberg, PA-C  pseudoephedrine (SUDAFED) 30 MG tablet Take 1 tablet (30 mg total) by mouth every 8 (eight) hours as needed for congestion. 02/11/23   Wallis Bamberg, PA-C  SUMAtriptan (IMITREX) 50 MG tablet Take 1 tablet (50 mg total) by mouth once as needed for migraine. May repeat dose once in 2 hours if no relief. Do not exceed 2 doses in 24 hours. 02/05/24     triamcinolone cream (KENALOG) 0.1 % Apply 1 Application topically 3 (three) times daily. 02/05/24     triamcinolone ointment (KENALOG) 0.1 % Apply 1 Application topically 2 (two) times daily. 12/25/23         Allergies    Sulfur, Hydrocodone, and Sulfa antibiotics    Review of Systems   Review of Systems  Physical Exam Updated Vital Signs BP (!) 145/98   Pulse 95   Temp 98.8 F (37.1 C) (Oral)   Resp 16   SpO2 97%  Physical Exam Vitals and nursing note reviewed.  Constitutional:      General: She  is not in acute distress.    Appearance: She is well-developed. She is not diaphoretic.  HENT:     Head: Normocephalic and atraumatic.  Eyes:     Pupils: Pupils are equal, round, and reactive to light.  Cardiovascular:     Rate and Rhythm: Normal rate and regular rhythm.     Heart sounds: No murmur heard.    No friction rub. No gallop.  Pulmonary:     Effort: Pulmonary effort is normal.     Breath sounds: No wheezing or rales.  Abdominal:     General: There is no distension.     Palpations: Abdomen is soft.     Tenderness: There is no abdominal tenderness.  Musculoskeletal:        General: No tenderness.     Cervical back: Normal range of motion and neck supple.  Skin:    General: Skin is warm and dry.   Neurological:     Mental Status: She is alert and oriented to person, place, and time.  Psychiatric:        Behavior: Behavior normal.     ED Results / Procedures / Treatments   Labs (all labs ordered are listed, but only abnormal results are displayed) Labs Reviewed  RESP PANEL BY RT-PCR (RSV, FLU A&B, COVID)  [id number]  CBC WITH DIFFERENTIAL/PLATELET  COMPREHENSIVE METABOLIC PANEL  LIPASE, BLOOD    EKG None  Radiology No results found.  Procedures Procedures  {Document cardiac monitor, telemetry assessment procedure when appropriate:1}  Medications Ordered in ED Medications  sodium chloride 0.9 % bolus 1,000 mL (has no administration in time range)  ondansetron (ZOFRAN) injection 4 mg (has no administration in time range)    ED Course/ Medical Decision Making/ A&P   {   Click here for ABCD2, HEART and other calculatorsREFRESH Note before signing :1}                              Medical Decision Making Amount and/or Complexity of Data Reviewed Labs: ordered.  Risk Prescription drug management.   43 yo F with a chief complaints of nausea and vomiting.  Going on for about 12 hours now.  Benign abdominal exam for me.  Will obtain a laboratory evaluation bolus of IV fluids antiemetics reassess.  {Document critical care time when appropriate:1} {Document review of labs and clinical decision tools ie heart score, Chads2Vasc2 etc:1}  {Document your independent review of radiology images, and any outside records:1} {Document your discussion with family members, caretakers, and with consultants:1} {Document social determinants of health affecting pt's care:1} {Document your decision making why or why not admission, treatments were needed:1} Final Clinical Impression(s) / ED Diagnoses Final diagnoses:  None    Rx / DC Orders ED Discharge Orders     None

## 2024-02-14 NOTE — ED Triage Notes (Signed)
 Pt presents via POV c/o generalized body aches, chills and nasuea that started last night. Reports dry cough.

## 2024-02-15 LAB — CBC WITH DIFFERENTIAL/PLATELET
Abs Immature Granulocytes: 0.02 10*3/uL (ref 0.00–0.07)
Basophils Absolute: 0 10*3/uL (ref 0.0–0.1)
Basophils Relative: 0 %
Eosinophils Absolute: 0 10*3/uL (ref 0.0–0.5)
Eosinophils Relative: 1 %
HCT: 39.3 % (ref 36.0–46.0)
Hemoglobin: 13.4 g/dL (ref 12.0–15.0)
Immature Granulocytes: 0 %
Lymphocytes Relative: 23 %
Lymphs Abs: 1.4 10*3/uL (ref 0.7–4.0)
MCH: 31.8 pg (ref 26.0–34.0)
MCHC: 34.1 g/dL (ref 30.0–36.0)
MCV: 93.1 fL (ref 80.0–100.0)
Monocytes Absolute: 0.4 10*3/uL (ref 0.1–1.0)
Monocytes Relative: 6 %
Neutro Abs: 4.2 10*3/uL (ref 1.7–7.7)
Neutrophils Relative %: 70 %
Platelets: 385 10*3/uL (ref 150–400)
RBC: 4.22 MIL/uL (ref 3.87–5.11)
RDW: 12.9 % (ref 11.5–15.5)
WBC: 6.1 10*3/uL (ref 4.0–10.5)
nRBC: 0 % (ref 0.0–0.2)

## 2024-02-15 LAB — COMPREHENSIVE METABOLIC PANEL
ALT: 20 U/L (ref 0–44)
AST: 18 U/L (ref 15–41)
Albumin: 4.8 g/dL (ref 3.5–5.0)
Alkaline Phosphatase: 36 U/L — ABNORMAL LOW (ref 38–126)
Anion gap: 8 (ref 5–15)
BUN: 11 mg/dL (ref 6–20)
CO2: 28 mmol/L (ref 22–32)
Calcium: 10 mg/dL (ref 8.9–10.3)
Chloride: 101 mmol/L (ref 98–111)
Creatinine, Ser: 0.79 mg/dL (ref 0.44–1.00)
GFR, Estimated: >60 mL/min (ref >60)
Glucose, Bld: 101 mg/dL — ABNORMAL HIGH (ref 70–99)
Potassium: 3.6 mmol/L (ref 3.5–5.1)
Sodium: 137 mmol/L (ref 135–145)
Total Bilirubin: 0.6 mg/dL (ref 0.0–1.2)
Total Protein: 7.8 g/dL (ref 6.5–8.1)

## 2024-02-15 LAB — LIPASE, BLOOD: Lipase: <10 U/L — ABNORMAL LOW (ref 11–51)

## 2024-02-15 MED ORDER — ONDANSETRON 4 MG PO TBDP: ORAL_TABLET | ORAL | 0 refills | Status: AC

## 2024-02-15 NOTE — Discharge Instructions (Signed)
Follow up with your PCP.  Return for inability to eat or drink, sudden worsening abdominal pain.

## 2024-02-19 ENCOUNTER — Other Ambulatory Visit: Payer: Self-pay

## 2024-02-19 MED ORDER — PANTOPRAZOLE SODIUM 40 MG PO TBEC
40.0000 mg | DELAYED_RELEASE_TABLET | Freq: Every day | ORAL | 0 refills | Status: DC
  Filled 2024-02-19: qty 90, 90d supply, fill #0

## 2024-02-19 MED ORDER — ONDANSETRON HCL 4 MG PO TABS
4.0000 mg | ORAL_TABLET | Freq: Three times a day (TID) | ORAL | 0 refills | Status: AC | PRN
  Filled 2024-02-19: qty 30, 5d supply, fill #0

## 2024-03-14 ENCOUNTER — Other Ambulatory Visit: Payer: Self-pay

## 2024-03-14 MED ORDER — LOPERAMIDE HCL 2 MG PO CAPS
2.0000 mg | ORAL_CAPSULE | Freq: Four times a day (QID) | ORAL | 0 refills | Status: AC
Start: 2024-03-14 — End: ?
  Filled 2024-03-14: qty 30, 8d supply, fill #0

## 2024-03-14 MED ORDER — PROMETHAZINE HCL 25 MG PO TABS
25.0000 mg | ORAL_TABLET | Freq: Four times a day (QID) | ORAL | 0 refills | Status: AC | PRN
  Filled 2024-03-14: qty 30, 8d supply, fill #0

## 2024-03-16 ENCOUNTER — Encounter (HOSPITAL_BASED_OUTPATIENT_CLINIC_OR_DEPARTMENT_OTHER): Payer: Self-pay

## 2024-03-16 ENCOUNTER — Emergency Department (HOSPITAL_BASED_OUTPATIENT_CLINIC_OR_DEPARTMENT_OTHER)
Admission: EM | Admit: 2024-03-16 | Discharge: 2024-03-17 | Disposition: A | Discharge status: Home / Self Care | Attending: Emergency Medicine | Admitting: Emergency Medicine

## 2024-03-16 ENCOUNTER — Other Ambulatory Visit: Payer: Self-pay

## 2024-03-16 DIAGNOSIS — R109 Unspecified abdominal pain: Secondary | ICD-10-CM | POA: Diagnosis not present

## 2024-03-16 DIAGNOSIS — R112 Nausea with vomiting, unspecified: Secondary | ICD-10-CM | POA: Diagnosis present

## 2024-03-16 DIAGNOSIS — Z7951 Long term (current) use of inhaled steroids: Secondary | ICD-10-CM | POA: Insufficient documentation

## 2024-03-16 DIAGNOSIS — E119 Type 2 diabetes mellitus without complications: Secondary | ICD-10-CM | POA: Diagnosis not present

## 2024-03-16 DIAGNOSIS — R197 Diarrhea, unspecified: Secondary | ICD-10-CM | POA: Diagnosis not present

## 2024-03-16 DIAGNOSIS — J45909 Unspecified asthma, uncomplicated: Secondary | ICD-10-CM | POA: Insufficient documentation

## 2024-03-16 DIAGNOSIS — R10A0 Flank pain, unspecified side: Secondary | ICD-10-CM

## 2024-03-16 DIAGNOSIS — I10 Essential (primary) hypertension: Secondary | ICD-10-CM | POA: Diagnosis not present

## 2024-03-16 DIAGNOSIS — R1110 Vomiting, unspecified: Secondary | ICD-10-CM

## 2024-03-16 LAB — URINALYSIS, ROUTINE W REFLEX MICROSCOPIC
Bilirubin Urine: NEGATIVE
Glucose, UA: NEGATIVE mg/dL
Ketones, ur: NEGATIVE mg/dL
Leukocytes,Ua: NEGATIVE
Nitrite: NEGATIVE
Specific Gravity, Urine: 1.028 (ref 1.005–1.030)
pH: 5.5 (ref 5.0–8.0)

## 2024-03-16 LAB — COMPREHENSIVE METABOLIC PANEL WITH GFR
ALT: 19 U/L (ref 0–44)
AST: 16 U/L (ref 15–41)
Albumin: 4.3 g/dL (ref 3.5–5.0)
Alkaline Phosphatase: 35 U/L — ABNORMAL LOW (ref 38–126)
Anion gap: 9 (ref 5–15)
BUN: 12 mg/dL (ref 6–20)
CO2: 27 mmol/L (ref 22–32)
Calcium: 9.6 mg/dL (ref 8.9–10.3)
Chloride: 103 mmol/L (ref 98–111)
Creatinine, Ser: 0.83 mg/dL (ref 0.44–1.00)
GFR, Estimated: >60 mL/min (ref >60)
Glucose, Bld: 112 mg/dL — ABNORMAL HIGH (ref 70–99)
Potassium: 3.3 mmol/L — ABNORMAL LOW (ref 3.5–5.1)
Sodium: 139 mmol/L (ref 135–145)
Total Bilirubin: 0.3 mg/dL (ref 0.0–1.2)
Total Protein: 7.4 g/dL (ref 6.5–8.1)

## 2024-03-16 LAB — CBC
HCT: 38.9 % (ref 36.0–46.0)
Hemoglobin: 13.4 g/dL (ref 12.0–15.0)
MCH: 32.1 pg (ref 26.0–34.0)
MCHC: 34.4 g/dL (ref 30.0–36.0)
MCV: 93.3 fL (ref 80.0–100.0)
Platelets: 350 10*3/uL (ref 150–400)
RBC: 4.17 MIL/uL (ref 3.87–5.11)
RDW: 12.9 % (ref 11.5–15.5)
WBC: 5.3 10*3/uL (ref 4.0–10.5)
nRBC: 0 % (ref 0.0–0.2)

## 2024-03-16 LAB — PREGNANCY, URINE: Preg Test, Ur: NEGATIVE

## 2024-03-16 LAB — LIPASE, BLOOD: Lipase: <10 U/L — ABNORMAL LOW (ref 11–51)

## 2024-03-16 MED ORDER — ONDANSETRON 4 MG PO TBDP
4.0000 mg | ORAL_TABLET | Freq: Once | ORAL | Status: DC
  Filled 2024-03-16: qty 1

## 2024-03-16 MED ORDER — LOPERAMIDE HCL 2 MG PO CAPS
4.0000 mg | ORAL_CAPSULE | Freq: Once | ORAL | Status: DC
  Filled 2024-03-16: qty 2

## 2024-03-16 NOTE — ED Triage Notes (Signed)
 Pt presents via POV c/o N/V/D x2 days and lower back and lower abd pain.

## 2024-03-16 NOTE — ED Provider Notes (Signed)
 [place] EMERGENCY DEPARTMENT AT [facility] Provider Note   CSN: [id number] Arrival date & time: 03/16/24  2049     History  Chief Complaint  Patient presents with   Abdominal Pain    Mackenzie Dillon is a 43 y.o. female.  HPI     This is a 43 year old female who presents with abdominal pain, flank pain, nausea, vomiting, and diarrhea.  Patient states that several days ago she developed some nonbilious, nonbloody emesis and nonbloody diarrhea.  That seems to have resolved; however she has had some crampy abdominal discomfort some right flank pain.  She is concerned she may have a UTI.  No fevers.  Pain sometimes does radiate into the right lower quadrant.  She has a history of kidney stones but states that this feels different.  Home Medications Prior to Admission medications   Medication Sig Start Date End Date Taking? Authorizing Provider  atorvastatin (LIPITOR) 40 MG tablet Take 40 mg by mouth daily.    [provider]  benzonatate (TESSALON) 100 MG capsule Take 1 capsule (100 mg total) by mouth 3 (three) times daily as needed. 12/07/23     cetirizine (ZYRTEC ALLERGY) 10 MG tablet Take 1 tablet (10 mg total) by mouth daily. 02/11/23   Wallis Bamberg, PA-C  cetirizine (ZYRTEC) 10 MG tablet Take 1 tablet (10 mg total) by mouth daily. 12/07/23     escitalopram (LEXAPRO) 20 MG tablet Take 20 mg by mouth daily.    [provider]  fluticasone (FLONASE) 50 MCG/ACT nasal spray Place 1 spray into both nostrils daily. 12/07/23     guaiFENesin (MUCINEX) 600 MG 12 hr tablet Take 1 tablet (600 mg total) by mouth 2 (two) times a day for 10 days. 12/07/23     lisinopril (ZESTRIL) 10 MG tablet Take 1 tablet by mouth daily. 05/18/20   [provider]  loperamide (IMODIUM) 2 MG capsule Take 1 capsule (2 mg total) by mouth 4 (four) times daily as needed for diarrhea 03/14/24     montelukast (SINGULAIR) 10 MG tablet Take 1 tablet by mouth daily. 03/02/20   [provider]  nirmatrelvir & ritonavir (PAXLOVID, 300/100,) 20 x 150 MG & 10 x 100MG  TBPK Take 2 tablets nirmtrelvir and 1 tablet ritonavir twice daily. 02/11/23   Wallis Bamberg, PA-C  ondansetron (ZOFRAN) 4 MG tablet Take 1-2 tablets (4-8 mg total) by mouth every 8 (eight) hours as needed for nausea or vomiting. 02/19/24     ondansetron (ZOFRAN-ODT) 4 MG disintegrating tablet 4mg  ODT q4 hours prn nausea/vomit 02/15/24   Melene Plan, DO  pantoprazole (PROTONIX) 40 MG tablet Take 1 tablet (40 mg total) by mouth daily before breakfast. 02/19/24     predniSONE (DELTASONE) 20 MG tablet Take 2 tablets (40 mg total) by mouth daily with breakfast. 12/12/22   Waldon Merl, PA-C  promethazine (PHENERGAN) 25 MG tablet Take 1 tablet (25 mg total) by mouth every 6 (six) hours as needed for nausea or vomiting. 03/14/24     promethazine-dextromethorphan (PROMETHAZINE-DM) 6.25-15 MG/5ML syrup Take 5 mLs by mouth 3 (three) times daily as needed for cough. 02/11/23   Wallis Bamberg, PA-C  pseudoephedrine (SUDAFED) 30 MG tablet Take 1 tablet (30 mg total) by mouth every 8 (eight) hours as needed for congestion. 02/11/23   Wallis Bamberg, PA-C  SUMAtriptan (IMITREX) 50 MG tablet Take 1 tablet (50 mg total) by mouth once as needed for migraine. May repeat dose once in 2 hours if no relief. Do  not exceed 2 doses in 24 hours. 02/05/24     triamcinolone cream (KENALOG) 0.1 % Apply 1 Application topically 3 (three) times daily. 02/05/24     triamcinolone ointment (KENALOG) 0.1 % Apply 1 Application topically 2 (two) times daily. 12/25/23         Allergies    Sulfur, Hydrocodone, and Sulfa antibiotics    Review of Systems   Review of Systems  Cardiovascular:  Negative for chest pain.  Gastrointestinal:  Positive for abdominal pain, diarrhea, nausea and vomiting.  Genitourinary:  Negative for dysuria and hematuria.  All other systems reviewed and are negative.   Physical Exam Updated Vital Signs BP (!) 123/90   Pulse (!) 102   Temp  98.7 F (37.1 C)   Resp 14   Ht 1.6 m (5\' 3" )   Wt 86.2 kg   LMP 03/01/2024 (Approximate)   SpO2 98%   BMI 33.66 kg/m  Physical Exam Vitals and nursing note reviewed.  Constitutional:      Appearance: She is well-developed. She is obese.  HENT:     Head: Normocephalic and atraumatic.  Eyes:     Pupils: Pupils are equal, round, and reactive to light.  Cardiovascular:     Rate and Rhythm: Normal rate and regular rhythm.     Heart sounds: Normal heart sounds.  Pulmonary:     Effort: Pulmonary effort is normal. No respiratory distress.     Breath sounds: No wheezing.  Abdominal:     General: Bowel sounds are normal.     Palpations: Abdomen is soft.     Tenderness: There is no abdominal tenderness. There is no right CVA tenderness, left CVA tenderness, guarding or rebound.  Musculoskeletal:     Cervical back: Neck supple.  Skin:    General: Skin is warm and dry.  Neurological:     General: No focal deficit present.     Mental Status: She is alert and oriented to person, place, and time.     ED Results / Procedures / Treatments   Labs (all labs ordered are listed, but only abnormal results are displayed) Labs Reviewed  LIPASE, BLOOD - Abnormal; Notable for the following components:      Result Value   Lipase <10 (*)    All other components within normal limits  COMPREHENSIVE METABOLIC PANEL WITH GFR - Abnormal; Notable for the following components:   Potassium 3.3 (*)    Glucose, Bld 112 (*)    Alkaline Phosphatase 35 (*)    All other components within normal limits  URINALYSIS, ROUTINE W REFLEX MICROSCOPIC - Abnormal; Notable for the following components:   Hgb urine dipstick SMALL (*)    Protein, ur TRACE (*)    Bacteria, UA RARE (*)    All other components within normal limits  CBC  PREGNANCY, URINE    EKG None  Radiology No results found.  Procedures Procedures    Medications Ordered in ED Medications  ondansetron (ZOFRAN-ODT) disintegrating tablet  4 mg (4 mg Oral Patient Refused/Not Given 03/16/24 2348)  loperamide (IMODIUM) capsule 4 mg (4 mg Oral Patient Refused/Not Given 03/16/24 2348)    ED Course/ Medical Decision Making/ A&P                                 Medical Decision Making Amount and/or Complexity of Data Reviewed Labs: ordered.  Risk Prescription drug management.   This patient presents to  the ED for concern of flank pain, vomiting, diarrhea, this involves an extensive number of treatment options, and is a complaint that carries with it a high risk of complications and morbidity.  I considered the following differential and admission for this acute, potentially life threatening condition.  The differential diagnosis includes viral illness, gastroenteritis, pyelonephritis, kidney stone, less likely appendicitis or cholecystitis  MDM:    This is a 43 year old female who presents with vomiting and diarrhea and developed abdominal and flank pain.  She is nontoxic.  Mildly tachycardic.  She is afebrile.  Her physical exam is benign.  No reproducible tenderness.  No focal or localized tenderness.  Labs obtained.  No leukocytosis.  LFTs are normal.  No metabolic derangements.  Urinalysis is not consistent with UTI and she does not have any urinary symptoms.  Discussed with the patient that this could be a kidney stone.  She does not wish to proceed with imaging.  She also declined any treatment because "I just wanted to make sure I did not have a urinary tract infection."  She states that she has nausea medication and ibuprofen at home.  We discussed return precautions.  (Labs, imaging, consults)  Labs: I Ordered, and personally interpreted labs.  The pertinent results include: CBC, CMP, lipase, urinalysis  Imaging Studies ordered: I ordered imaging studies including none I independently visualized and interpreted imaging. I agree with the radiologist interpretation  Additional history obtained from chart review.  External  records from outside source obtained and reviewed including prior evaluations  Cardiac Monitoring: The patient was not maintained on a cardiac monitor.  If on the cardiac monitor, I personally viewed and interpreted the cardiac monitored which showed an underlying rhythm of: N/A  Reevaluation: After the interventions noted above, I reevaluated the patient and found that they have :improved  Social Determinants of Health:  lives independently  Disposition: Discharge  Co morbidities that complicate the patient evaluation  Past Medical History:  Diagnosis Date   Asthma    Atrial fibrillation ([facility])    Diabetes mellitus without complication ([facility])    Hypercholesterolemia    Hypertension    Plantar fasciitis      Medicines Meds ordered this encounter  Medications   ondansetron (ZOFRAN-ODT) disintegrating tablet 4 mg   loperamide (IMODIUM) capsule 4 mg    I have reviewed the patients home medicines and have made adjustments as needed  Problem List / ED Course: Problem List Items Addressed This Visit   None Visit Diagnoses       Vomiting and diarrhea    -  Primary     Flank pain                       Final Clinical Impression(s) / ED Diagnoses Final diagnoses:  Vomiting and diarrhea  Flank pain    Rx / DC Orders ED Discharge Orders     None         Ameri Cahoon, Mayer Masker, MD 03/17/24 0000

## 2024-03-16 NOTE — Discharge Instructions (Signed)
 You were seen today for flank pain, vomiting, and diarrhea.  Your workup today is reassuring.  Your labs are normal.  No evidence of urinary tract infection.  Just know if your pain does not resolve, kidney stone is a consideration although you do not believe that this is consistent.  Make sure that you are staying hydrated.  You may take your home nausea medication or ibuprofen for ongoing pain.

## 2024-03-17 MED ORDER — FLUTICASONE PROPIONATE 50 MCG/ACT NA SUSP: 1.0000 | Freq: Every day | NASAL | 2 refills | Status: AC

## 2024-05-23 ENCOUNTER — Other Ambulatory Visit: Payer: Self-pay

## 2024-05-24 ENCOUNTER — Other Ambulatory Visit: Payer: Self-pay

## 2024-05-24 MED ORDER — PANTOPRAZOLE SODIUM 40 MG PO TBEC
40.0000 mg | DELAYED_RELEASE_TABLET | Freq: Every day | ORAL | 0 refills | Status: DC
  Filled 2024-05-24: qty 90, 90d supply, fill #0

## 2024-05-29 ENCOUNTER — Other Ambulatory Visit: Payer: Self-pay

## 2024-05-29 MED ORDER — TIZANIDINE HCL 4 MG PO TABS
4.0000 mg | ORAL_TABLET | Freq: Three times a day (TID) | ORAL | 0 refills | Status: AC | PRN
  Filled 2024-05-29: qty 21, 7d supply, fill #0

## 2024-05-29 MED ORDER — NAPROXEN 500 MG PO TABS
ORAL_TABLET | ORAL | 0 refills | Status: AC
  Filled 2024-05-29: qty 20, 10d supply, fill #0

## 2024-05-29 MED ORDER — NAPROXEN 500 MG PO TABS
500.0000 mg | ORAL_TABLET | Freq: Two times a day (BID) | ORAL | 0 refills | Status: AC
  Filled 2024-05-29: qty 20, 10d supply, fill #0

## 2024-05-30 ENCOUNTER — Other Ambulatory Visit: Payer: Self-pay

## 2024-05-30 ENCOUNTER — Encounter (HOSPITAL_BASED_OUTPATIENT_CLINIC_OR_DEPARTMENT_OTHER): Payer: Self-pay

## 2024-05-30 ENCOUNTER — Emergency Department (HOSPITAL_BASED_OUTPATIENT_CLINIC_OR_DEPARTMENT_OTHER)
Admission: EM | Admit: 2024-05-30 | Discharge: 2024-05-30 | Disposition: A | Discharge status: Home / Self Care | Attending: Emergency Medicine | Admitting: Emergency Medicine

## 2024-05-30 DIAGNOSIS — X58XXXA Exposure to other specified factors, initial encounter: Secondary | ICD-10-CM | POA: Diagnosis not present

## 2024-05-30 DIAGNOSIS — M545 Low back pain, unspecified: Secondary | ICD-10-CM | POA: Diagnosis present

## 2024-05-30 DIAGNOSIS — S39012A Strain of muscle, fascia and tendon of lower back, initial encounter: Secondary | ICD-10-CM | POA: Diagnosis not present

## 2024-05-30 MED ORDER — LIDOCAINE 5 % EX PTCH: 1.0000 | MEDICATED_PATCH | CUTANEOUS | 0 refills | Status: AC

## 2024-05-30 MED ORDER — PREDNISONE 50 MG PO TABS: ORAL_TABLET | ORAL | 0 refills | Status: AC

## 2024-05-30 MED ORDER — METHOCARBAMOL 500 MG PO TABS: 500.0000 mg | ORAL_TABLET | Freq: Two times a day (BID) | ORAL | 0 refills | Status: AC

## 2024-05-30 NOTE — ED Provider Notes (Signed)
 [place] EMERGENCY DEPARTMENT AT [facility] Provider Note   CSN: [id number] Arrival date & time: 05/30/24  1540     Patient presents with: Back Pain   Mackenzie Dillon is a 43 y.o. female.   43 year old female presents with 2 weeks of lower lumbar back pain.  States the pain is positional.  No associate urinary symptoms.  No radicular symptoms.  No bowel or bladder dysfunction.  Pain is worse at night when she stops moving.  Better when she starts moving.  Denies any rashes.       Prior to Admission medications   Medication Sig Start Date End Date Taking? Authorizing Provider  Azelastine HCl 137 MCG/SPRAY SOLN Place 2 sprays into the nose 2 (two) times daily as needed. 04/22/24  Yes [provider]  Clindamycin-Benzoyl Per, Refr, gel Apply 1 Application topically 2 (two) times daily. 04/22/24  Yes [provider]  metoprolol succinate (TOPROL-XL) 25 MG 24 hr tablet Take 12.5 mg by mouth daily. 09/01/20  Yes [provider]  atorvastatin (LIPITOR) 40 MG tablet Take 40 mg by mouth daily.    [provider]  benzonatate  (TESSALON ) 100 MG capsule Take 1 capsule (100 mg total) by mouth 3 (three) times daily as needed. 12/07/23     cetirizine  (ZYRTEC  ALLERGY) 10 MG tablet Take 1 tablet (10 mg total) by mouth daily. 02/11/23   Adolph Hoop, PA-C  cetirizine  (ZYRTEC ) 10 MG tablet Take 1 tablet (10 mg total) by mouth daily. 12/07/23     escitalopram (LEXAPRO) 20 MG tablet Take 20 mg by mouth daily.    [provider]  fluticasone  (FLONASE ) 50 MCG/ACT nasal spray Place 1 spray into both nostrils daily. 12/07/23     fluticasone  (FLONASE ) 50 MCG/ACT nasal spray Place 1 spray into both nostrils daily. 03/17/24   Horton, Vonzella Guernsey, MD  guaiFENesin  (MUCINEX ) 600 MG 12 hr tablet Take 1 tablet (600 mg total) by mouth 2 (two) times a day for 10 days. 12/07/23     lisinopril (ZESTRIL) 10 MG tablet Take 1 tablet by mouth daily. 05/18/20   [provider]  loperamide  (IMODIUM ) 2 MG capsule Take 1 capsule (2 mg total) by mouth 4 (four) times daily as needed for diarrhea 03/14/24     montelukast (SINGULAIR) 10 MG tablet Take 1 tablet by mouth daily. 03/02/20   [provider]  naproxen (NAPROSYN) 500 MG tablet Take 1 tablet (500 mg total) by mouth in the morning and 1 tablet (500 mg total) in the evening. Take with meals. Do all this for 10 days. Take with food. 05/29/24     naproxen (NAPROSYN) 500 MG tablet Take 1 tablet (500 mg total) by mouth in the morning AND 1 tablet (500 mg total) every evening as needed for pain. Take with food. 05/29/24     nirmatrelvir  & ritonavir  (PAXLOVID , 300/100,) 20 x 150 MG & 10 x 100MG  TBPK Take 2 tablets nirmtrelvir and 1 tablet ritonavir  twice daily. 02/11/23   Adolph Hoop, PA-C  ondansetron  (ZOFRAN ) 4 MG tablet Take 1-2 tablets (4-8 mg total) by mouth every 8 (eight) hours as needed for nausea or vomiting. 02/19/24     ondansetron  (ZOFRAN -ODT) 4 MG disintegrating tablet 4mg  ODT q4 hours prn nausea/vomit 02/15/24   Floyd, Dan, DO  pantoprazole  (PROTONIX ) 40 MG tablet Take 1 tablet (40 mg total) by mouth daily before breakfast. 05/24/24     predniSONE  (DELTASONE ) 20 MG tablet Take 2 tablets (40 mg total) by mouth daily  with breakfast. 12/12/22   Farris Hong, PA-C  promethazine  (PHENERGAN ) 25 MG tablet Take 1 tablet (25 mg total) by mouth every 6 (six) hours as needed for nausea or vomiting. 03/14/24     promethazine -dextromethorphan (PROMETHAZINE -DM) 6.25-15 MG/5ML syrup Take 5 mLs by mouth 3 (three) times daily as needed for cough. 02/11/23   Adolph Hoop, PA-C  pseudoephedrine  (SUDAFED) 30 MG tablet Take 1 tablet (30 mg total) by mouth every 8 (eight) hours as needed for congestion. 02/11/23   Adolph Hoop, PA-C  SUMAtriptan  (IMITREX ) 50 MG tablet Take 1 tablet (50 mg total) by mouth once as needed for migraine. May repeat dose once in 2 hours if no relief. Do not exceed 2 doses in 24 hours. 02/05/24      tiZANidine (ZANAFLEX) 4 MG tablet Take 1 tablet (4 mg total) by mouth every 8 (eight) hours as needed FOR MUSCLE SPASMS. DO NOT TAKE WHILE WORKING OR DRIVING. 0/45/40     triamcinolone  cream (KENALOG ) 0.1 % Apply 1 Application topically 3 (three) times daily. 02/05/24       Allergies: Sulfur, Hydrocodone, and Sulfa antibiotics    Review of Systems  All other systems reviewed and are negative.   Updated Vital Signs BP (!) 146/87   Pulse 66   Temp (!) 97.5 F (36.4 C) (Oral)   Resp 14   Ht 1.6 m (5' 3)   Wt 89.4 kg   LMP 05/30/2024 (Exact Date)   SpO2 100%   BMI 34.90 kg/m   Physical Exam Vitals and nursing note reviewed.  Constitutional:      General: She is not in acute distress.    Appearance: Normal appearance. She is well-developed. She is not toxic-appearing.  HENT:     Head: Normocephalic and atraumatic.   Eyes:     General: Lids are normal.     Conjunctiva/sclera: Conjunctivae normal.     Pupils: Pupils are equal, round, and reactive to light.   Neck:     Thyroid: No thyroid mass.     Trachea: No tracheal deviation.   Cardiovascular:     Rate and Rhythm: Normal rate and regular rhythm.     Heart sounds: Normal heart sounds. No murmur heard.    No gallop.  Pulmonary:     Effort: Pulmonary effort is normal. No respiratory distress.     Breath sounds: Normal breath sounds. No stridor. No decreased breath sounds, wheezing, rhonchi or rales.  Abdominal:     General: There is no distension.     Palpations: Abdomen is soft.     Tenderness: There is no abdominal tenderness. There is no rebound.   Musculoskeletal:        General: No tenderness. Normal range of motion.     Cervical back: Normal range of motion and neck supple.       Back:   Skin:    General: Skin is warm and dry.     Findings: No abrasion or rash.   Neurological:     Mental Status: She is alert and oriented to person, place, and time. Mental status is at baseline.     GCS: GCS eye  subscore is 4. GCS verbal subscore is 5. GCS motor subscore is 6.     Cranial Nerves: No cranial nerve deficit.     Sensory: No sensory deficit.     Motor: Motor function is intact.   Psychiatric:        Attention and Perception: Attention normal.  Speech: Speech normal.        Behavior: Behavior normal.     (all labs ordered are listed, but only abnormal results are displayed) Labs Reviewed - No data to display  EKG: None  Radiology: No results found.   Procedures   Medications Ordered in the ED - No data to display                                  Medical Decision Making  Patient with MSK back pain here.  No cauda equina red flags.  No urinary symptoms.  Will prescribe course of prednisone , lidocaine  patches, muscle relaxants     Final diagnoses:  None    ED Discharge Orders     None          Lind Repine, MD 05/30/24 1710

## 2024-05-30 NOTE — ED Triage Notes (Signed)
 Pt reports lower back pain x2 weeks, more on the R side. Pt reports denies any injury or trauma.

## 2024-08-23 ENCOUNTER — Other Ambulatory Visit: Payer: Self-pay

## 2024-08-27 ENCOUNTER — Other Ambulatory Visit: Payer: Self-pay

## 2024-12-31 ENCOUNTER — Other Ambulatory Visit: Payer: Self-pay
